# Patient Record
Sex: Female | Born: 1995 | Hispanic: No | Marital: Single | Smoking: Never smoker
Health system: Southern US, Community
[De-identification: ages and names within clinical notes are randomized; demographics above are authoritative.]

---
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
## 2015-05-25 ENCOUNTER — Emergency Department (HOSPITAL_COMMUNITY)
Admission: EM | Admit: 2015-05-25 | Discharge: 2015-05-25 | Disposition: A | Discharge status: Home / Self Care | Payer: BLUE CROSS/BLUE SHIELD | Attending: Emergency Medicine | Admitting: Emergency Medicine

## 2015-05-25 ENCOUNTER — Emergency Department (HOSPITAL_COMMUNITY): Payer: BLUE CROSS/BLUE SHIELD

## 2015-05-25 ENCOUNTER — Encounter (HOSPITAL_COMMUNITY): Payer: Self-pay | Admitting: *Deleted

## 2015-05-25 DIAGNOSIS — R1011 Right upper quadrant pain: Secondary | ICD-10-CM

## 2015-05-25 DIAGNOSIS — M25511 Pain in right shoulder: Secondary | ICD-10-CM | POA: Insufficient documentation

## 2015-05-25 DIAGNOSIS — Z3202 Encounter for pregnancy test, result negative: Secondary | ICD-10-CM | POA: Diagnosis not present

## 2015-05-25 LAB — CBC WITH DIFFERENTIAL/PLATELET
BASOS ABS: 0 10*3/uL (ref 0.0–0.1)
BASOS PCT: 0 %
EOS ABS: 0.3 10*3/uL (ref 0.0–0.7)
Eosinophils Relative: 3 %
HEMATOCRIT: 34.6 % — AB (ref 36.0–46.0)
HEMOGLOBIN: 11.7 g/dL — AB (ref 12.0–15.0)
Lymphocytes Relative: 15 %
Lymphs Abs: 1.6 10*3/uL (ref 0.7–4.0)
MCH: 27.3 pg (ref 26.0–34.0)
MCHC: 33.8 g/dL (ref 30.0–36.0)
MCV: 80.8 fL (ref 78.0–100.0)
MONOS PCT: 5 %
Monocytes Absolute: 0.6 10*3/uL (ref 0.1–1.0)
NEUTROS ABS: 8.2 10*3/uL — AB (ref 1.7–7.7)
NEUTROS PCT: 77 %
Platelets: 371 10*3/uL (ref 150–400)
RBC: 4.28 MIL/uL (ref 3.87–5.11)
RDW: 13.8 % (ref 11.5–15.5)
WBC: 10.7 10*3/uL — ABNORMAL HIGH (ref 4.0–10.5)

## 2015-05-25 LAB — PREGNANCY, URINE: PREG TEST UR: NEGATIVE

## 2015-05-25 LAB — BASIC METABOLIC PANEL
ANION GAP: 8 (ref 5–15)
BUN: 8 mg/dL (ref 6–20)
CALCIUM: 9.3 mg/dL (ref 8.9–10.3)
CHLORIDE: 105 mmol/L (ref 101–111)
CO2: 26 mmol/L (ref 22–32)
CREATININE: 0.63 mg/dL (ref 0.44–1.00)
GFR calc non Af Amer: >60 mL/min (ref >60)
Glucose, Bld: 99 mg/dL (ref 65–99)
Potassium: 3.9 mmol/L (ref 3.5–5.1)
SODIUM: 139 mmol/L (ref 135–145)

## 2015-05-25 LAB — URINALYSIS, ROUTINE W REFLEX MICROSCOPIC
Glucose, UA: NEGATIVE mg/dL
HGB URINE DIPSTICK: NEGATIVE
Ketones, ur: NEGATIVE mg/dL
Leukocytes, UA: NEGATIVE
NITRITE: NEGATIVE
PROTEIN: NEGATIVE mg/dL
Specific Gravity, Urine: 1.027 (ref 1.005–1.030)
UROBILINOGEN UA: 4 mg/dL — AB (ref 0.0–1.0)
pH: 6.5 (ref 5.0–8.0)

## 2015-05-25 LAB — LIPASE, BLOOD: LIPASE: 20 U/L (ref 11–51)

## 2015-05-25 MED ORDER — IBUPROFEN 400 MG PO TABS: 400.0000 mg | ORAL_TABLET | Freq: Four times a day (QID) | ORAL | Status: DC | PRN

## 2015-05-25 NOTE — ED Notes (Signed)
The pt is c/o  Rt shoulder pain for 4 days she just woke up  With the pain.  She was seen at an urgent care soemwhere here in town and when he lay her flat and pressed on her rt upper abd she had severe pain.Marland Kitchen. He sent her here  For  Having a gallbladder attack   Her pain is worse with breathing   No n v or diarrhea.  lmp now

## 2015-05-25 NOTE — ED Provider Notes (Signed)
CSN: [id number]     Arrival date & time 05/25/15  1548 History   First MD Initiated Contact with Patient 05/25/15 1615     Chief Complaint  Patient presents with  . Shoulder Pain     (Consider location/radiation/quality/duration/timing/severity/associated sxs/prior Treatment) HPI   19 year old female sent here by urgent care for evaluation of right shoulder pain. Patient states 4 days ago she woke up with mild sharp pain to her right shoulder. Her pain was initially minimal but has gotten progressively worse.  She reported having increasing pain when taking deep breath. She has been taking ibuprofen on occasion for pain with some improvement. Her pain has not 4 resolved and today she went to urgent care for evaluation. States when he provider pushed on her right upper abdomen she developed pain to her right shoulder. She was sent to the ED for further evaluation of this condition and to rule out gallbladder etiology. She denies having any fever, chills, neck pain, chest pain, difficulty breathing, productive cough, shortness of breath, postprandial pain, nausea vomiting diarrhea, dysuria, or rash. No history of diabetes or alcohol abuse. Patient is currently on her menstruation. She denies any history of gallbladder disease. She denies any loss of appetite. Her pain is mild at this time.  Patient also denies any prior history of PE or DVT, no recent surgery, prolonged bed rest, using oral contraception, active cancer, or hemoptysis. Denies any unilateral leg swelling or calf pain.  History reviewed. No pertinent past medical history. History reviewed. No pertinent past surgical history. No family history on file. Social History  Substance Use Topics  . Smoking status: Never Smoker   . Smokeless tobacco: None  . Alcohol Use: No   OB History    No data available     Review of Systems  All other systems reviewed and are negative.     Allergies  Review of patient's allergies indicates  no known allergies.  Home Medications   Prior to Admission medications   Not on File   BP 100/74 mmHg  Pulse 95  Temp(Src) 98 F (36.7 C) (Oral)  Resp 18  SpO2 98%  LMP 05/25/2015 Physical Exam  Constitutional: She appears well-developed and well-nourished. No distress.  HENT:  Head: Atraumatic.  Eyes: Conjunctivae are normal.  Neck: Neck supple.  Cardiovascular: Normal rate and regular rhythm.   Pulmonary/Chest: Effort normal and breath sounds normal.  Abdominal: Soft. Bowel sounds are normal. She exhibits no distension. There is tenderness (mild RUQ abd tenderness without guarding or rebound tenderness.  No pain at McBurney's point.  ).  Genitourinary:  No CVA tenderness  Musculoskeletal:  Right shoulder with full range of motion, nontender to palpation, no overlying skin changes. Normal grip strength  Neurological: She is alert.  Skin: No rash noted.  Psychiatric: She has a normal mood and affect.  Nursing note and vitals reviewed.   ED Course  Procedures (including critical care time)  Patient presents with right shoulder pain and right upper quadrant abdominal pain. Her pain is not related to eating which makes it less likely to be gallbladder etiology. She has no reproducible shoulder pain on exam. She is otherwise healthy and well-appearing. She does complain of some pleuritic component therefore chest x-ray ordered. She is PERC negative, low suspicion for PE. I also have no suspicion for cardiac etiology. I did perform a bedside ultrasound to assess her gallbladder which appears normal. However, a formal abdominal ultrasound will be ordered.  7:34 PM No significant  abnormality noted on patient's labs panel aside from elevated urobilinogen of 4.0. Liver appears normal on [place]. Pregnancy test is negative. A chest x-ray shows no active abnormalities. Abdominal and pelvis ultrasound showing no acute pathology. I discussed the finding with patient and recommend outpatient  follow-up with PCP for further evaluation. Strict return precautions discussed. Patient voiced understanding and agrees with plan. Patient is stable for discharge.  Labs Review Labs Reviewed  CBC WITH DIFFERENTIAL/PLATELET - Abnormal; Notable for the following:    WBC 10.7 (*)    Hemoglobin 11.7 (*)    HCT 34.6 (*)    Neutro Abs 8.2 (*)    All other components within normal limits  URINALYSIS, ROUTINE W REFLEX MICROSCOPIC (NOT AT [facility]) - Abnormal; Notable for the following:    Color, Urine AMBER (*)    Bilirubin Urine SMALL (*)    Urobilinogen, UA 4.0 (*)    All other components within normal limits  LIPASE, BLOOD  BASIC METABOLIC PANEL  PREGNANCY, URINE    Imaging Review Dg Chest 2 View  05/25/2015  CLINICAL DATA:  Congestion for 2 days. EXAM: CHEST  2 VIEW COMPARISON:  None. FINDINGS: The heart size and mediastinal contours are within normal limits. Both lungs are clear. No pneumothorax or pleural effusion is noted. The visualized skeletal structures are unremarkable. IMPRESSION: No active cardiopulmonary disease. Electronically Signed   By: Lupita Raider, M.D.   On: 05/25/2015 18:15   US Abdomen Complete  05/25/2015  CLINICAL DATA:  Left upper quadrant abdominal pain. EXAM: ULTRASOUND ABDOMEN COMPLETE COMPARISON:  None. FINDINGS: Gallbladder: No gallstones or wall thickening visualized. No sonographic Murphy sign noted. Common bile duct: Diameter: 2.2 mm which is within normal limits. Liver: No focal lesion identified. Within normal limits in parenchymal echogenicity. IVC: No abnormality visualized. Pancreas: Visualized portion unremarkable. Spleen: Size and appearance within normal limits. Right Kidney: Length: 10.1 cm. Echogenicity within normal limits. No mass or hydronephrosis visualized. Left Kidney: Length: 10.1 cm. Echogenicity within normal limits. No mass or hydronephrosis visualized. Abdominal aorta: No aneurysm visualized. Other findings: None. IMPRESSION: No abnormality  seen in the abdomen. Electronically Signed   By: Lupita Raider, M.D.   On: 05/25/2015 19:19   I have personally reviewed and evaluated these images and lab results as part of my medical decision-making.   EKG Interpretation None      MDM   Final diagnoses:  Right shoulder pain  RUQ abdominal pain    BP 100/74 mmHg  Pulse 95  Temp(Src) 98 F (36.7 C) (Oral)  Resp 18  SpO2 98%  LMP 05/25/2015     Fayrene Helper, PA-C 05/25/15 1937  Fayrene Helper, PA-C 05/25/15 1938  Raeford Razor, MD 05/27/15 [phone]

## 2015-05-25 NOTE — Discharge Instructions (Signed)
Abdominal Pain, Adult °Many things can cause abdominal pain. Usually, abdominal pain is not caused by a disease and will improve without treatment. It can often be observed and treated at home. Your health care provider will do a physical exam and possibly order blood tests and X-rays to help determine the seriousness of your pain. However, in many cases, more time must pass before a clear cause of the pain can be found. Before that point, your health care provider may not know if you need more testing or further treatment. °HOME CARE INSTRUCTIONS °Monitor your abdominal pain for any changes. The following actions may help to alleviate any discomfort you are experiencing: °· Only take over-the-counter or prescription medicines as directed by your health care provider. °· Do not take laxatives unless directed to do so by your health care provider. °· Try a clear liquid diet (broth, tea, or water) as directed by your health care provider. Slowly move to a bland diet as tolerated. °SEEK MEDICAL CARE IF: °· You have unexplained abdominal pain. °· You have abdominal pain associated with nausea or diarrhea. °· You have pain when you urinate or have a bowel movement. °· You experience abdominal pain that wakes you in the night. °· You have abdominal pain that is worsened or improved by eating food. °· You have abdominal pain that is worsened with eating fatty foods. °· You have a fever. °SEEK IMMEDIATE MEDICAL CARE IF: °· Your pain does not go away within 2 hours. °· You keep throwing up (vomiting). °· Your pain is felt only in portions of the abdomen, such as the right side or the left lower portion of the abdomen. °· You pass bloody or black tarry stools. °MAKE SURE YOU: °· Understand these instructions. °· Will watch your condition. °· Will get help right away if you are not doing well or get worse. °  °This information is not intended to replace advice given to you by your health care provider. Make sure you discuss  any questions you have with your health care provider. °  °Document Released: 04/28/2005 Document Revised: 04/09/2015 Document Reviewed: 03/28/2013 °Elsevier Interactive Patient Education ©2016 Elsevier Inc. ° ° °Emergency Department Resource Guide °1) Find a Doctor and Pay Out of Pocket °Although you won't have to find out who is covered by your insurance plan, it is a good idea to ask around and get recommendations. You will then need to call the office and see if the doctor you have chosen will accept you as a new patient and what types of options they offer for patients who are self-pay. Some doctors offer discounts or will set up payment plans for their patients who do not have insurance, but you will need to ask so you aren't surprised when you get to your appointment. ° °2) Contact Your Local Health Department °Not all health departments have doctors that can see patients for sick visits, but many do, so it is worth a call to see if yours does. If you don't know where your local health department is, you can check in your phone book. The CDC also has a tool to help you locate your state's health department, and many state websites also have listings of all of their local health departments. ° °3) Find a Walk-in Clinic °If your illness is not likely to be very severe or complicated, you may want to try a walk in clinic. These are popping up all over the country in pharmacies, drugstores, and shopping centers.   They're usually staffed by nurse practitioners or physician assistants that have been trained to treat common illnesses and complaints. They're usually fairly quick and inexpensive. However, if you have serious medical issues or chronic medical problems, these are probably not your best option. ° °No Primary Care Doctor: °- Call [facility] at  832-8000 - they can help you locate a primary care doctor that  accepts your insurance, provides certain services, etc. °- Physician Referral Service-  1-800-533-3463 ° °Chronic Pain Problems: °Organization         Address  Phone   Notes  °[facility]  (336) 297-2271 Patients need to be referred by their primary care doctor.  ° °Medication Assistance: °Organization         Address  Phone   Notes  °[place] [facility] [address] °[place], [place] [address] (336) 641-8030 --Must be a resident of [place] °-- Must have NO insurance coverage whatsoever (no Medicaid/ Medicare, etc.) °-- The pt. MUST have a primary care doctor that directs their care regularly and follows them in the community °  °[facility]  (866) 331-1348   °[employer]  (888) 892-1162   ° °Agencies that provide inexpensive medical care: °Organization         Address  Phone   Notes  °[facility]  (336) 832-8035   °[facility]    (336) 832-7272   °[facility] [address] °[place], [place] [address] (336) 832-4777   °[facility] [address], °[place] (336) 271-4999   °[facility]    (336) 373-0678   °[facility]    (336) 272-1050   °[facility] ° [address], [place] Phone:  (336) 832-4444, Fax:  (336) 832-4440 Hours of Operation:  9 am - 6 pm, M-F.  Also accepts Medicaid/Medicare and self-pay.  °[facility] ° [address], [place] Phone: (336) [phone], Fax: (336) [phone]. Hours of Operation:  8:30 am - 5:30 pm, M-F.  Also accepts Medicaid and self-pay.  °[facility] [place] [address], [place] Phone: (336) 878-6027   °[facility] [address], [place], [place] (336)723-1848, Ext. 123 Mondays & Thursdays: 7-9 AM.  First 15 patients are seen on a first come, first serve basis. °  ° °Medicaid-accepting [place] Providers: ° °Organization         Address  Phone   Notes  °[facility] [address],  [place] (336) 641-2100 Also accepts self-pay patients.  °[facility] [address], [place] ° (336) 856-9996   °[facility] [address], [place] (336) 288-8857   °[facility] [address] [place] [address], [place] (336) 299-7000   °Veita Bland [address], [place]  ° (336) 373-1557 Only accepts [place] Access Medicaid patients after they have their name applied to their card.  ° °Self-Pay (no insurance) in [place]: ° °Organization         Address  Phone   Notes  °Sickle Cell Patients, [facility] [address], [place] (336) 832-1970   °[facility] [address], [place] (336) 832-4400   °[facility] [place] [facility] ° [address], [place] (336) 992-4800   °  [facility]/Dr. Osei-Bonsu ° [address] [place] [address], [place] or [address], [place] (336) [phone] Phone number for both [place] and [place] locations is the same.  °[facility] [address], [place] (336) 299-0000   °[facility] [address] [place] [address], [place] or [address] (336) 852-7530 °(336) 878-2260   °[facility] [address], [place] (336) 350-1642, phone; (336) 294-5005, fax Sees patients 1st and 3rd Saturday of every month.  Must not qualify for public or private insurance (i.e. Medicaid, Medicare, [place] Health Choice, Veterans' Benefits) • Household income should be no more than 200% of the poverty level •The clinic cannot treat you if you are pregnant or think you are pregnant • Sexually transmitted diseases are not treated at the clinic.  ° ° °Dental Care: °Organization         Address  Phone  Notes  °[facility] [address], [place] (336) 641-6152 Accepts children up to age 21 who are enrolled in  Medicaid or [place] Health Choice; pregnant women with a Medicaid card; and children who have applied for Medicaid or [place] Health Choice, but were declined, whose parents can pay a reduced fee at time of service.  °[facility] [place]  [address], [place] (336) 641-7733 Accepts children up to age 21 who are enrolled in Medicaid or [place] Health Choice; pregnant women with a Medicaid card; and children who have applied for Medicaid or [place] Health Choice, but were declined, whose parents can pay a reduced fee at time of service.  °[facility] ° [address], [place] (336) 641-4533 Patients are seen by appointment only. Walk-ins are not accepted. [facility] will see patients 18 years of age and older. °Monday - Tuesday (8am-5pm) °Most Wednesdays (8:30-5pm) °$30 per visit, cash only  °[facility] ° [address], [place] (336) 641-4533 Patients are seen by appointment only. Walk-ins are not accepted. [facility] will see patients 18 years of age and older. °One Wednesday Evening (Monthly: Volunteer Based).  $30 per visit, cash only  °[facility]  (919) 537-3737 for adults; Children under age 4, call Graduate Pediatric Dentistry at (919) [phone]. Children aged 4-14, please call (919) [phone] to request a pediatric application. ° Dental services are provided in all areas of dental care including fillings, crowns and bridges, complete and partial dentures, implants, gum treatment, root canals, and extractions. Preventive care is also provided. Treatment is provided to both adults and children. °Patients are selected via a lottery and there is often a waiting list. °  °[facility] [address], ° ° (336) 763-8833 [email or web address] °  °[facility] [address], [place], [place] (336)723-1848, Ext. 123 Second and Fourth Thursday of each month, opens at 6:30  AM; Clinic ends at 9 AM.  Patients are seen on a first-come first-served basis, and a limited number are seen during each clinic.  ° °[facility] ° [address], [place], [place] (336) 723-7904   Eligibility Requirements °You must have lived in [place], [place], or [place] counties for at least the last three months. °  You cannot be eligible for state or federal sponsored healthcare insurance, including Veterans Administration, Medicaid, or Medicare. °  You generally cannot be eligible for healthcare insurance   through your employer.  °  How to apply: °Eligibility screenings are held every Tuesday and Wednesday afternoon from 1:00 pm until 4:00 pm. You do not need an appointment for the interview!  °[facility] [address], [place], [place] 336-631-2330   °[facility]  336-342-8273   °[facility]  336-703-3100   °[facility]  336-570-6415   ° °Behavioral Health Resources in the Community: °Intensive Outpatient Programs °Organization         Address  Phone  Notes  °[facility] [address], [place], [place] 336-878-6098   °[facility] [address], [place], [place] 336-832-9800   °[facility] [address], [place], [place] ° 336-882-2125   °[facility] [address],  °[place], [place] 1-800-853-5163 or 336-641-4981   °Substance Abuse Resources °Organization         Address  Phone  Notes  °[facility]  336-882-2125   °[facility]  336-784-9470   °[facility]  336-285-9073   °[facility]  336-845-3988   °Residential & Outpatient Substance Abuse Program  1-800-659-3381   °Psychological Services °Organization         Address  Phone  Notes  °[facility]  336- 832-9600   °[facility]  336- 378-7881   °[facility] [address], [place] 1-800-853-5163 or  336-641-4981   ° °Mobile Crisis Teams °Organization         Address  Phone  Notes  °[facility], Mobile Crisis Care Unit  1-877-626-1772   °Assertive °[facility] ° [address] [place], [place] 336-834-9664   °Sharon DeEsch [address] °[place] 336-554-5454   ° °Self-Help/Support Groups °Organization         Address  Phone             Notes  °[facility] - variety of support groups  336- 373-1402 Call for more information  °Narcotics Anonymous (NA), [facility] [address], °[place]  2 meetings at this location  ° °Residential Treatment Programs °Organization         Address  Phone  Notes  °[facility] [address],    °[place]  1-866-801-8205   °[facility] ° [address], [place], [place] 704-293-8524   °[facility] [address], [place] 336-845-3988 Admissions: 8am-3pm M-F  °[facility] [address]    °[place], [place] 336-841-1104   °[facility] [address], [place], [place] 336-379-7146   °[facility] [address]  °[place], [place] 336-285-9073   °[facility] - Intensive Outpatient [address], [place], [place] 336-852-3033   °[facility] [address]  °[place], [place] 1-877-615-2722 or 336-784-9470   °[facility] [address] , [place] 336-227-7417 Accepts Medicaid  °[facility] [address]  °[place] 1-800-659-3381 Substance Abuse/Addiction Treatment  ° °[place] Behavioral Health Resources °Organization         Address  Phone  Notes  °[facility]  (888) 581-9988   °Julie Brannon, PhD [address] [place], [place]   (336) 349-5553 or (336) 951-0000   °[facility]   [address] °[place],  (336) 349-4454   °  [facility] [address],  [place], [place] (336) 342-8316 Insurance/Medicaid/sponsorship through [facility]  °[facility] [address]                                    [place], [place] (336) 342-8316 Therapy/tele-psych/case  °[facility] [address]  ° [place], [place] (336) 349-2233    °Dr. Arfeen  (336) 349-4544   °[facility] [place]  [employer] [place] [facility] 1) [address], [place] °2) [address], [place] °3)  [address], [place] (336) 349-3220 °(336) 342-7768 ° °(336) 342-8140   °[place] Child Abuse Hotline (336) 342-1394 or (336) 342-3537 (After Hours)    ° ° ° °

## 2015-06-02 ENCOUNTER — Emergency Department (HOSPITAL_COMMUNITY): Payer: BLUE CROSS/BLUE SHIELD

## 2015-06-02 ENCOUNTER — Emergency Department (HOSPITAL_COMMUNITY)
Admission: EM | Admit: 2015-06-02 | Discharge: 2015-06-02 | Disposition: A | Discharge status: Home / Self Care | Payer: BLUE CROSS/BLUE SHIELD | Attending: Emergency Medicine | Admitting: Emergency Medicine

## 2015-06-02 ENCOUNTER — Encounter (HOSPITAL_COMMUNITY): Payer: Self-pay | Admitting: Family Medicine

## 2015-06-02 DIAGNOSIS — R109 Unspecified abdominal pain: Secondary | ICD-10-CM | POA: Diagnosis present

## 2015-06-02 DIAGNOSIS — M25512 Pain in left shoulder: Secondary | ICD-10-CM | POA: Insufficient documentation

## 2015-06-02 DIAGNOSIS — R0789 Other chest pain: Secondary | ICD-10-CM | POA: Insufficient documentation

## 2015-06-02 DIAGNOSIS — M25511 Pain in right shoulder: Secondary | ICD-10-CM | POA: Insufficient documentation

## 2015-06-02 DIAGNOSIS — Z3202 Encounter for pregnancy test, result negative: Secondary | ICD-10-CM | POA: Diagnosis not present

## 2015-06-02 DIAGNOSIS — N390 Urinary tract infection, site not specified: Secondary | ICD-10-CM | POA: Diagnosis not present

## 2015-06-02 DIAGNOSIS — R0602 Shortness of breath: Secondary | ICD-10-CM | POA: Diagnosis not present

## 2015-06-02 LAB — URINALYSIS, ROUTINE W REFLEX MICROSCOPIC
BILIRUBIN URINE: NEGATIVE
Glucose, UA: NEGATIVE mg/dL
Hgb urine dipstick: NEGATIVE
Ketones, ur: NEGATIVE mg/dL
NITRITE: NEGATIVE
PH: 6 (ref 5.0–8.0)
Protein, ur: NEGATIVE mg/dL
SPECIFIC GRAVITY, URINE: 1.032 — AB (ref 1.005–1.030)
Urobilinogen, UA: 1 mg/dL (ref 0.0–1.0)

## 2015-06-02 LAB — CBC WITH DIFFERENTIAL/PLATELET
Basophils Absolute: 0 10*3/uL (ref 0.0–0.1)
Basophils Relative: 0 %
EOS PCT: 1 %
Eosinophils Absolute: 0.2 10*3/uL (ref 0.0–0.7)
HEMATOCRIT: 34.5 % — AB (ref 36.0–46.0)
HEMOGLOBIN: 11.6 g/dL — AB (ref 12.0–15.0)
LYMPHS ABS: 3 10*3/uL (ref 0.7–4.0)
LYMPHS PCT: 23 %
MCH: 27.5 pg (ref 26.0–34.0)
MCHC: 33.6 g/dL (ref 30.0–36.0)
MCV: 81.8 fL (ref 78.0–100.0)
Monocytes Absolute: 1.2 10*3/uL — ABNORMAL HIGH (ref 0.1–1.0)
Monocytes Relative: 9 %
NEUTROS ABS: 8.6 10*3/uL — AB (ref 1.7–7.7)
NEUTROS PCT: 67 %
Platelets: 424 10*3/uL — ABNORMAL HIGH (ref 150–400)
RBC: 4.22 MIL/uL (ref 3.87–5.11)
RDW: 14.1 % (ref 11.5–15.5)
WBC: 13 10*3/uL — AB (ref 4.0–10.5)

## 2015-06-02 LAB — BASIC METABOLIC PANEL
Anion gap: 8 (ref 5–15)
BUN: 24 mg/dL — AB (ref 6–20)
CALCIUM: 8.8 mg/dL — AB (ref 8.9–10.3)
CHLORIDE: 103 mmol/L (ref 101–111)
CO2: 28 mmol/L (ref 22–32)
Creatinine, Ser: 0.75 mg/dL (ref 0.44–1.00)
GFR calc Af Amer: >60 mL/min (ref >60)
GLUCOSE: 99 mg/dL (ref 65–99)
POTASSIUM: 3.8 mmol/L (ref 3.5–5.1)
Sodium: 139 mmol/L (ref 135–145)

## 2015-06-02 LAB — URINE MICROSCOPIC-ADD ON

## 2015-06-02 LAB — HEPATIC FUNCTION PANEL
ALT: 16 U/L (ref 14–54)
AST: 12 U/L — AB (ref 15–41)
Albumin: 3.2 g/dL — ABNORMAL LOW (ref 3.5–5.0)
Alkaline Phosphatase: 66 U/L (ref 38–126)
BILIRUBIN TOTAL: 0.3 mg/dL (ref 0.3–1.2)
Total Protein: 6.8 g/dL (ref 6.5–8.1)

## 2015-06-02 LAB — PREGNANCY, URINE: Preg Test, Ur: NEGATIVE

## 2015-06-02 LAB — D-DIMER, QUANTITATIVE: D-Dimer, Quant: 2.42 ug/mL-FEU — ABNORMAL HIGH (ref 0.00–0.48)

## 2015-06-02 MED ORDER — NAPROXEN 250 MG PO TABS: 250.0000 mg | ORAL_TABLET | Freq: Two times a day (BID) | ORAL | Status: DC

## 2015-06-02 MED ORDER — CEPHALEXIN 500 MG PO CAPS: 500.0000 mg | ORAL_CAPSULE | Freq: Four times a day (QID) | ORAL | Status: DC

## 2015-06-02 MED ORDER — IOHEXOL 350 MG/ML SOLN
100.0000 mL | Freq: Once | INTRAVENOUS | Status: AC | PRN
  Administered 2015-06-02: 75 mL via INTRAVENOUS

## 2015-06-02 NOTE — ED Provider Notes (Signed)
CSN: [id number]     Arrival date & time 06/02/15  4098 History   First MD Initiated Contact with Patient 06/02/15 0602     Chief Complaint  Patient presents with  . Flank Pain   Kelly Hill is a 19 y.o. female who is otherwise healthy who presents to the emergency department complaining of intermittent bilateral shoulder pain and right lateral chest wall ongoing for the past 2 weeks. She reports she has intermittent sharp pains that are worse at night and with laying down. She reports she has intermittent episodes lasting several hours at times where it hurts to take a deep breath and she feels short of breath. She reports she has constant pain in her bilateral shoulders and right lateral chest wall but this fluctuates in intensity. She reports her pain is not related to eating. The patient reports she was seen in urgent care in the emergency department for her pain. The patient has had a chest x-ray and a complete abdominal ultrasound on 05/25/2015 which were unremarkable. Patient currently reports she has very mild pain but this can fluctuate greatly in intensity. Patient denies personal or close family history of DVTs or PEs. She denies endogenous estrogen use. She denies fevers, chills, leg pain, leg swelling, abdominal pain, nausea, vomiting, diarrhea, cough, wheezing, urinary symptoms, vaginal bleeding, vaginal discharge, or rashes.    (Consider location/radiation/quality/duration/timing/severity/associated sxs/prior Treatment) HPI  History reviewed. No pertinent past medical history. History reviewed. No pertinent past surgical history. History reviewed. No pertinent family history. Social History  Substance Use Topics  . Smoking status: Never Smoker   . Smokeless tobacco: None  . Alcohol Use: No   OB History    No data available     Review of Systems  Constitutional: Negative for fever and chills.  HENT: Negative for congestion and sore throat.   Eyes: Negative for visual  disturbance.  Respiratory: Positive for shortness of breath (intermittent ). Negative for cough and wheezing.   Cardiovascular: Negative for chest pain and palpitations.  Gastrointestinal: Negative for nausea, vomiting, abdominal pain, diarrhea and blood in stool.  Genitourinary: Positive for flank pain. Negative for dysuria, urgency, frequency, hematuria, decreased urine volume, vaginal bleeding, vaginal discharge, difficulty urinating and menstrual problem.  Musculoskeletal: Negative for back pain and neck pain.  Skin: Negative for rash.  Neurological: Negative for syncope, light-headedness and headaches.      Allergies  Review of patient's allergies indicates no known allergies.  Home Medications   Prior to Admission medications   Medication Sig Start Date End Date Taking? Authorizing Provider  cyclobenzaprine (FLEXERIL) 10 MG tablet Take 10 mg by mouth daily. 05/26/15  Yes Historical Provider, MD  cephALEXin (KEFLEX) 500 MG capsule Take 1 capsule (500 mg total) by mouth 4 (four) times daily. 06/02/15   Everlene Farrier, PA-C  naproxen (NAPROSYN) 250 MG tablet Take 1 tablet (250 mg total) by mouth 2 (two) times daily with a meal. 06/02/15   Everlene Farrier, PA-C  predniSONE (STERAPRED UNI-PAK 21 TAB) 10 MG (21) TBPK tablet Take 10-60 mg by mouth as directed. Take 6-5-4-3-2-1 tablets on consecutive days for 6 days. 05/26/15   Historical Provider, MD   BP 120/75 mmHg  Pulse 102  Temp(Src) 98 F (36.7 C) (Oral)  Resp 23  Ht 5\' 2"  (1.575 m)  Wt 130 lb (58.968 kg)  BMI 23.77 kg/m2  SpO2 100%  LMP 05/25/2015 Physical Exam  Constitutional: She appears well-developed and well-nourished. No distress.  Nontoxic appearing.  HENT:  Head:  Normocephalic and atraumatic.  Mouth/Throat: Oropharynx is clear and moist.  Eyes: Conjunctivae are normal. Pupils are equal, round, and reactive to light. Right eye exhibits no discharge. Left eye exhibits no discharge.  Neck: Normal range of motion.  Neck supple. No JVD present.  Cardiovascular: Regular rhythm, normal heart sounds and intact distal pulses.  Exam reveals no gallop and no friction rub.   No murmur heard. Heart rate is 105. Bilateral radial, posterior tibialis and dorsalis pedis pulses are intact.    Pulmonary/Chest: Effort normal and breath sounds normal. No respiratory distress. She has no wheezes. She has no rales. She exhibits no tenderness.  Lungs are clear to auscultation bilaterally. No chest wall tenderness.  Abdominal: Soft. Bowel sounds are normal. She exhibits no distension. There is no tenderness. There is no rebound and no guarding.  Abdomen is soft and nontender to palpation. Bowel sounds are present.  Musculoskeletal: She exhibits no edema or tenderness.  No lower extremity edema or tenderness. No bony point shoulder tenderness bilaterally. No pain with manipulation of her bilateral shoulders.  Lymphadenopathy:    She has no cervical adenopathy.  Neurological: She is alert. Coordination normal.  Skin: Skin is warm and dry. No rash noted. She is not diaphoretic. No erythema. No pallor.  Psychiatric: She has a normal mood and affect. Her behavior is normal.  Nursing note and vitals reviewed.   ED Course  Procedures (including critical care time) Labs Review Labs Reviewed  URINALYSIS, ROUTINE W REFLEX MICROSCOPIC (NOT AT [facility]) - Abnormal; Notable for the following:    APPearance CLOUDY (*)    Specific Gravity, Urine 1.032 (*)    Leukocytes, UA TRACE (*)    All other components within normal limits  CBC WITH DIFFERENTIAL/PLATELET - Abnormal; Notable for the following:    WBC 13.0 (*)    Hemoglobin 11.6 (*)    HCT 34.5 (*)    Platelets 424 (*)    Neutro Abs 8.6 (*)    Monocytes Absolute 1.2 (*)    All other components within normal limits  BASIC METABOLIC PANEL - Abnormal; Notable for the following:    BUN 24 (*)    Calcium 8.8 (*)    All other components within normal limits  URINE MICROSCOPIC-ADD  ON - Abnormal; Notable for the following:    Squamous Epithelial / LPF FEW (*)    Bacteria, UA MANY (*)    All other components within normal limits  HEPATIC FUNCTION PANEL - Abnormal; Notable for the following:    Albumin 3.2 (*)    AST 12 (*)    Bilirubin, Direct <0.1 (*)    All other components within normal limits  D-DIMER, QUANTITATIVE (NOT AT [facility]) - Abnormal; Notable for the following:    D-Dimer, Quant 2.42 (*)    All other components within normal limits  URINE CULTURE  PREGNANCY, URINE    Imaging Review Ct Angio Chest Pe W/cm &/or Wo Cm  06/02/2015  CLINICAL DATA:  Right-sided chest pain and shortness of breath. EXAM: CT ANGIOGRAPHY CHEST WITH CONTRAST TECHNIQUE: Multidetector CT imaging of the chest was performed using the standard protocol during bolus administration of intravenous contrast. Multiplanar CT image reconstructions and MIPs were obtained to evaluate the vascular anatomy. CONTRAST:  75mL OMNIPAQUE IOHEXOL 350 MG/ML SOLN COMPARISON:  None. FINDINGS: There is adequate opacification of the pulmonary arteries. There is no pulmonary embolus. The main pulmonary artery, right main pulmonary artery and left main pulmonary arteries are normal in size. The heart  size is normal. There is no pericardial effusion. The thoracic aorta is normal in caliber. There is no thoracic aortic dissection. There is mild bibasilar atelectasis. The lungs are otherwise clear. There is no focal consolidation, pleural effusion or pneumothorax. There is no axillary, hilar, or mediastinal adenopathy. There is no lytic or blastic osseous lesion. The visualized portions of the upper abdomen are unremarkable. Review of the MIP images confirms the above findings. IMPRESSION: 1. No evidence of pulmonary embolus. 2. No acute cardiopulmonary disease. No findings to explain the patient's right-sided chest pain. Electronically Signed   By: Elige KoHetal  Patel   On: 06/02/2015 11:15   I have personally reviewed and  evaluated these images and lab results as part of my medical decision-making.   EKG Interpretation   Date/Time:  Monday June 02 2015 07:53:54 EDT Ventricular Rate:  97 PR Interval:  118 QRS Duration: 88 QT Interval:  349 QTC Calculation: 443 R Axis:   69 Text Interpretation:  Sinus rhythm Borderline short PR interval RSR' in V1  or V2, probably normal variant No old tracing to compare Confirmed by  GOLDSTON  MD, SCOTT ([id number]) on 06/02/2015 9:14:51 AM      Filed Vitals:   06/02/15 0915 06/02/15 0930 06/02/15 1131 06/02/15 1200  BP:  118/72 133/89 120/75  Pulse: 102 92 118 102  Temp:      TempSrc:      Resp: 21 19 18 23   Height:      Weight:      SpO2: 100% 100% 99% 100%     MDM   Meds given in ED:  Medications  iohexol (OMNIPAQUE) 350 MG/ML injection 100 mL (75 mLs Intravenous Contrast Given 06/02/15 1044)    Discharge Medication List as of 06/02/2015 12:16 PM    START taking these medications   Details  cephALEXin (KEFLEX) 500 MG capsule Take 1 capsule (500 mg total) by mouth 4 (four) times daily., Starting 06/02/2015, Until Discontinued, Print    naproxen (NAPROSYN) 250 MG tablet Take 1 tablet (250 mg total) by mouth 2 (two) times daily with a meal., Starting 06/02/2015, Until Discontinued, Print        Final diagnoses:  Right flank pain  UTI (lower urinary tract infection)  Bilateral shoulder pain   This is a 19 y.o. female who is otherwise healthy who presents to the emergency department complaining of intermittent bilateral shoulder pain and right lateral chest wall ongoing for the past 2 weeks. She reports she has intermittent sharp pains that are worse at night and with laying down. She reports she has intermittent episodes lasting several hours at times where it hurts to take a deep breath and she feels short of breath. She reports she has constant pain in her bilateral shoulders and right lateral chest wall but this fluctuates in intensity. She reports  her pain is not related to eating. The patient reports she was seen in urgent care in the emergency department for her pain. The patient has had a chest x-ray and a complete abdominal ultrasound on 05/25/2015 which were unremarkable. No evidence of hydronephrosis. On exam patient is afebrile and nontoxic appearing. Patient is mildly tachycardic with a heart rate of 110 during my interview. Her lungs are clear to auscultation bilaterally. She has no bony point tenderness or chest wall tenderness on exam. She is no pain with manipulation of her shoulder joint. No lower extremity edema or tenderness.  I reviewed her abdominal ultrasound that showed no evidence of hydronephrosis and  was unremarkable. I also evaluated her chest x-ray which was unremarkable.  CBC today indicates a mild leukocytosis of 13,000. Hemoglobin is 11.6 and stable. BMP is unremarkable. Hepatic function is unremarkable. Urinalysis shows trace leukocytes with many bacteria. Patient denies urinary symptoms. As the patient complains of intermittent periods where she feels short of breath and has pain in her flank will obtain d-dimer to rule out PE.  D-dimer returned elevated 2.42. Will obtain CT angiogram of her chest. CT imaging of the chest indicates no evidence of pulmonary embolism and no finding cyst explain the patient's right-sided chest pain. After discussing with my attending Dr. Criss Alvine will send urine for culture and treat her trace leukocytes and many bacteria on her urinalysis is a urinary tract infection. Will check Keflex 500 mg 4 times a day for 10 days. This could be the possible cause of her flank pain. Patient is in agreement with this plan. Prior to discharge the patient reports she feels comfortable and ready for discharge. Will provide her with prescription for Naprosyn for pain control. I encouraged close follow-up by primary care. I advised strict return precautions. I advised the patient to follow-up with their primary  care provider this week. I advised the patient to return to the emergency department with new or worsening symptoms or new concerns. The patient verbalized understanding and agreement with plan.    This patient was discussed with Dr. Criss Alvine who agrees with assessment and plan.     Everlene Farrier, PA-C 06/02/15 1620  Pricilla Loveless, MD 06/05/15 [phone]

## 2015-06-02 NOTE — ED Notes (Signed)
MD at bedside. 

## 2015-06-02 NOTE — ED Notes (Signed)
Pt returned from CT °

## 2015-06-02 NOTE — Discharge Instructions (Signed)
Flank Pain Flank pain refers to pain that is located on the side of the body between the upper abdomen and the back. The pain may occur over a short period of time (acute) or may be long-term or reoccurring (chronic). It may be mild or severe. Flank pain can be caused by many things. CAUSES  Some of the more common causes of flank pain include:  Muscle strains.   Muscle spasms.   A disease of your spine (vertebral disk disease).   A lung infection (pneumonia).   Fluid around your lungs (pulmonary edema).   A kidney infection.   Kidney stones.   A very painful skin rash caused by the chickenpox virus (shingles).   Gallbladder disease.  HOME CARE INSTRUCTIONS  Home care will depend on the cause of your pain. In general,  Rest as directed by your caregiver.  Drink enough fluids to keep your urine clear or pale yellow.  Only take over-the-counter or prescription medicines as directed by your caregiver. Some medicines may help relieve the pain.  Tell your caregiver about any changes in your pain.  Follow up with your caregiver as directed. SEEK IMMEDIATE MEDICAL CARE IF:   Your pain is not controlled with medicine.   You have new or worsening symptoms.  Your pain increases.   You have abdominal pain.   You have shortness of breath.   You have persistent nausea or vomiting.   You have swelling in your abdomen.   You feel faint or pass out.   You have blood in your urine.  You have a fever or persistent symptoms for more than 2-3 days.  You have a fever and your symptoms suddenly get worse. MAKE SURE YOU:   Understand these instructions.  Will watch your condition.  Will get help right away if you are not doing well or get worse.   This information is not intended to replace advice given to you by your health care provider. Make sure you discuss any questions you have with your health care provider.   Document Released: 09/09/2005 Document  Revised: 04/12/2012 Document Reviewed: 03/02/2012 Elsevier Interactive Patient Education 2016 Else[employer] Inc[employer] Urinary Tract Infection Urinary tract infections (UTIs) can develop anywhere along your urinary tract. Your urinary tract is your body's drainage system for removing wastes and extra water. Your urinary tract includes two kidneys, two ureters, a bladder, and a urethra. Your kidneys are a pair of bean-shaped organs. Each kidney is about the size of your fist. They are located below your ribs, one on each side of your spine. CAUSES Infections are caused by microbes, which are microscopic organisms, including fungi, viruses, and bacteria. These organisms are so small that they can only be seen through a microscope. Bacteria are the microbes that most commonly cause UTIs. SYMPTOMS  Symptoms of UTIs may vary by age and gender of the patient and by the location of the infection. Symptoms in young women typically include a frequent and intense urge to urinate and a painful, burning feeling in the bladder or urethra during urination. Older women and men are more likely to be tired, shaky, and weak and have muscle aches and abdominal pain. A fever may mean the infection is in your kidneys. Other symptoms of a kidney infection include pain in your back or sides below the ribs, nausea, and vomiting. DIAGNOSIS To diagnose a UTI, your caregiver will ask you about your symptoms. Your caregiver will also ask you to provide a urine sample. The urine  sample will be tested for bacteria and white blood cells. White blood cells are made by your body to help fight infection. TREATMENT  Typically, UTIs can be treated with medication. Because most UTIs are caused by a bacterial infection, they usually can be treated with the use of antibiotics. The choice of antibiotic and length of treatment depend on your symptoms and the type of bacteria causing your infection. HOME CARE INSTRUCTIONS  If you were prescribed  antibiotics, take them exactly as your caregiver instructs you. Finish the medication even if you feel better after you have only taken some of the medication.  Drink enough water and fluids to keep your urine clear or pale yellow.  Avoid caffeine, tea, and carbonated beverages. They tend to irritate your bladder.  Empty your bladder often. Avoid holding urine for long periods of time.  Empty your bladder before and after sexual intercourse.  After a bowel movement, women should cleanse from front to back. Use each tissue only once. SEEK MEDICAL CARE IF:   You have back pain.  You develop a fever.  Your symptoms do not begin to resolve within 3 days. SEEK IMMEDIATE MEDICAL CARE IF:   You have severe back pain or lower abdominal pain.  You develop chills.  You have nausea or vomiting.  You have continued burning or discomfort with urination. MAKE SURE YOU:   Understand these instructions.  Will watch your condition.  Will get help right away if you are not doing well or get worse.   This information is not intended to replace advice given to you by your health care provider. Make sure you discuss any questions you have with your health care provider.   Document Released: 04/28/2005 Document Revised: 04/09/2015 Document Reviewed: 08/27/2011 Elsevier Interactive Patient Education 2016 Elsevier Inc. Shoulder Pain The shoulder is the joint that connects your arms to your body. The bones that form the shoulder joint include the upper arm bone (humerus), the shoulder blade (scapula), and the collarbone (clavicle). The top of the humerus is shaped like a ball and fits into a rather flat socket on the scapula (glenoid cavity). A combination of muscles and strong, fibrous tissues that connect muscles to bones (tendons) support your shoulder joint and hold the ball in the socket. Small, fluid-filled sacs (bursae) are located in different areas of the joint. They act as cushions between  the bones and the overlying soft tissues and help reduce friction between the gliding tendons and the bone as you move your arm. Your shoulder joint allows a wide range of motion in your arm. This range of motion allows you to do things like scratch your back or throw a ball. However, this range of motion also makes your shoulder more prone to pain from overuse and injury. Causes of shoulder pain can originate from both injury and overuse and usually can be grouped in the following four categories:  Redness, swelling, and pain (inflammation) of the tendon (tendinitis) or the bursae (bursitis).  Instability, such as a dislocation of the joint.  Inflammation of the joint (arthritis).  Broken bone (fracture). HOME CARE INSTRUCTIONS   Apply ice to the sore area.  Put ice in a plastic bag.  Place a towel between your skin and the bag.  Leave the ice on for 15-20 minutes, 3-4 times per day for the first 2 days, or as directed by your health care provider.  Stop using cold packs if they do not help with the pain.  If  you have a shoulder sling or immobilizer, wear it as long as your caregiver instructs. Only remove it to shower or bathe. Move your arm as little as possible, but keep your hand moving to prevent swelling.  Squeeze a soft ball or foam pad as much as possible to help prevent swelling.  Only take over-the-counter or prescription medicines for pain, discomfort, or fever as directed by your caregiver. SEEK MEDICAL CARE IF:   Your shoulder pain increases, or new pain develops in your arm, hand, or fingers.  Your hand or fingers become cold and numb.  Your pain is not relieved with medicines. SEEK IMMEDIATE MEDICAL CARE IF:   Your arm, hand, or fingers are numb or tingling.  Your arm, hand, or fingers are significantly swollen or turn white or blue. MAKE SURE YOU:   Understand these instructions.  Will watch your condition.  Will get help right away if you are not doing  well or get worse.   This information is not intended to replace advice given to you by your health care provider. Make sure you discuss any questions you have with your health care provider.   Document Released: 04/28/2005 Document Revised: 08/09/2014 Document Reviewed: 11/11/2014 Elsevier Interactive Patient Education [employer]

## 2015-06-02 NOTE — ED Notes (Signed)
Pt to CT

## 2015-06-02 NOTE — ED Notes (Signed)
Pt is complaining of right flank pain that started "sometime last week, I think". Denies any urinary symptoms, fever, nausea, vomiting, diarrhea, or vaginal discharge.

## 2015-06-03 LAB — URINE CULTURE: CULTURE: NO GROWTH

## 2016-12-27 IMAGING — CR DG CHEST 2V
2 series · 2 of 2 positions shown · non-contrast
Comparison: None.

CLINICAL DATA: Congestion for 2 days.

EXAM:
CHEST  2 VIEW

[chest pa]
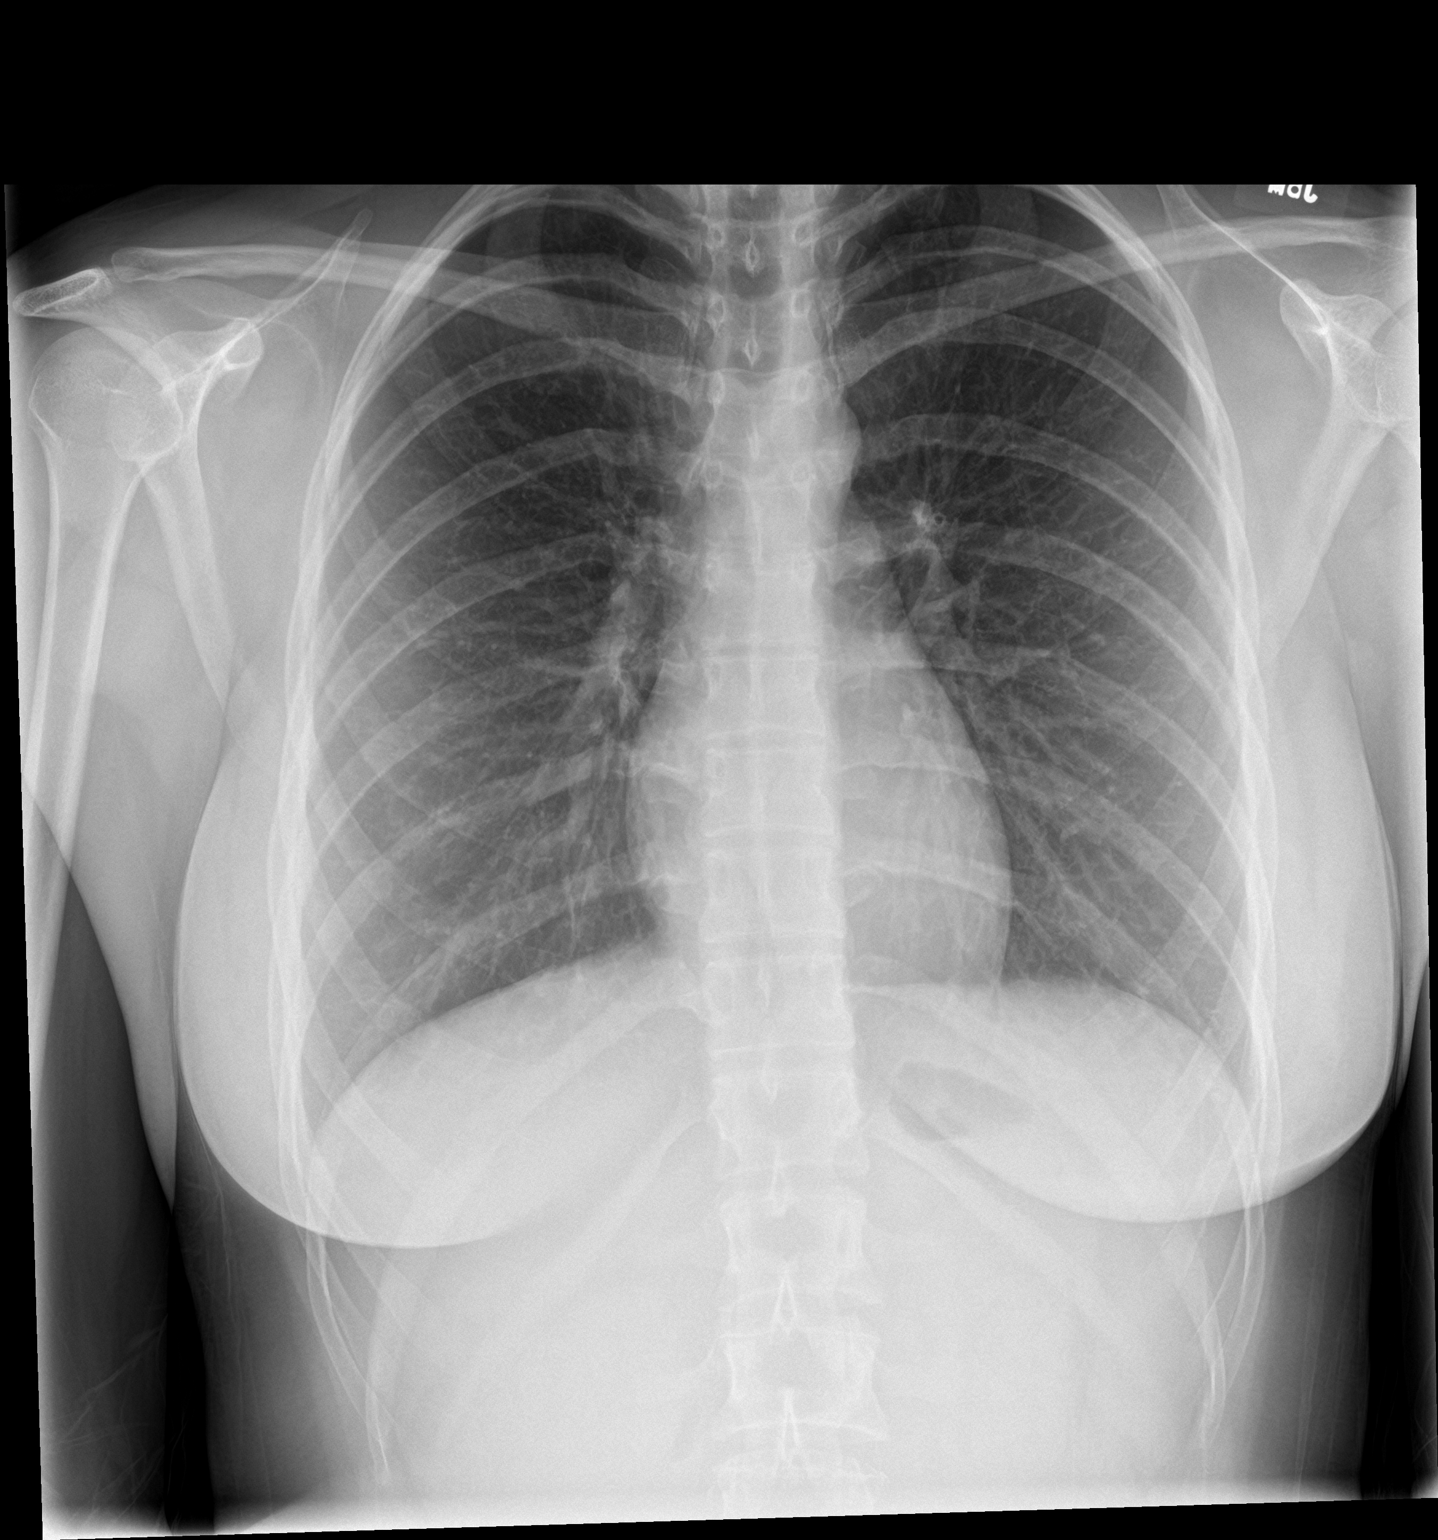

[chest lat]
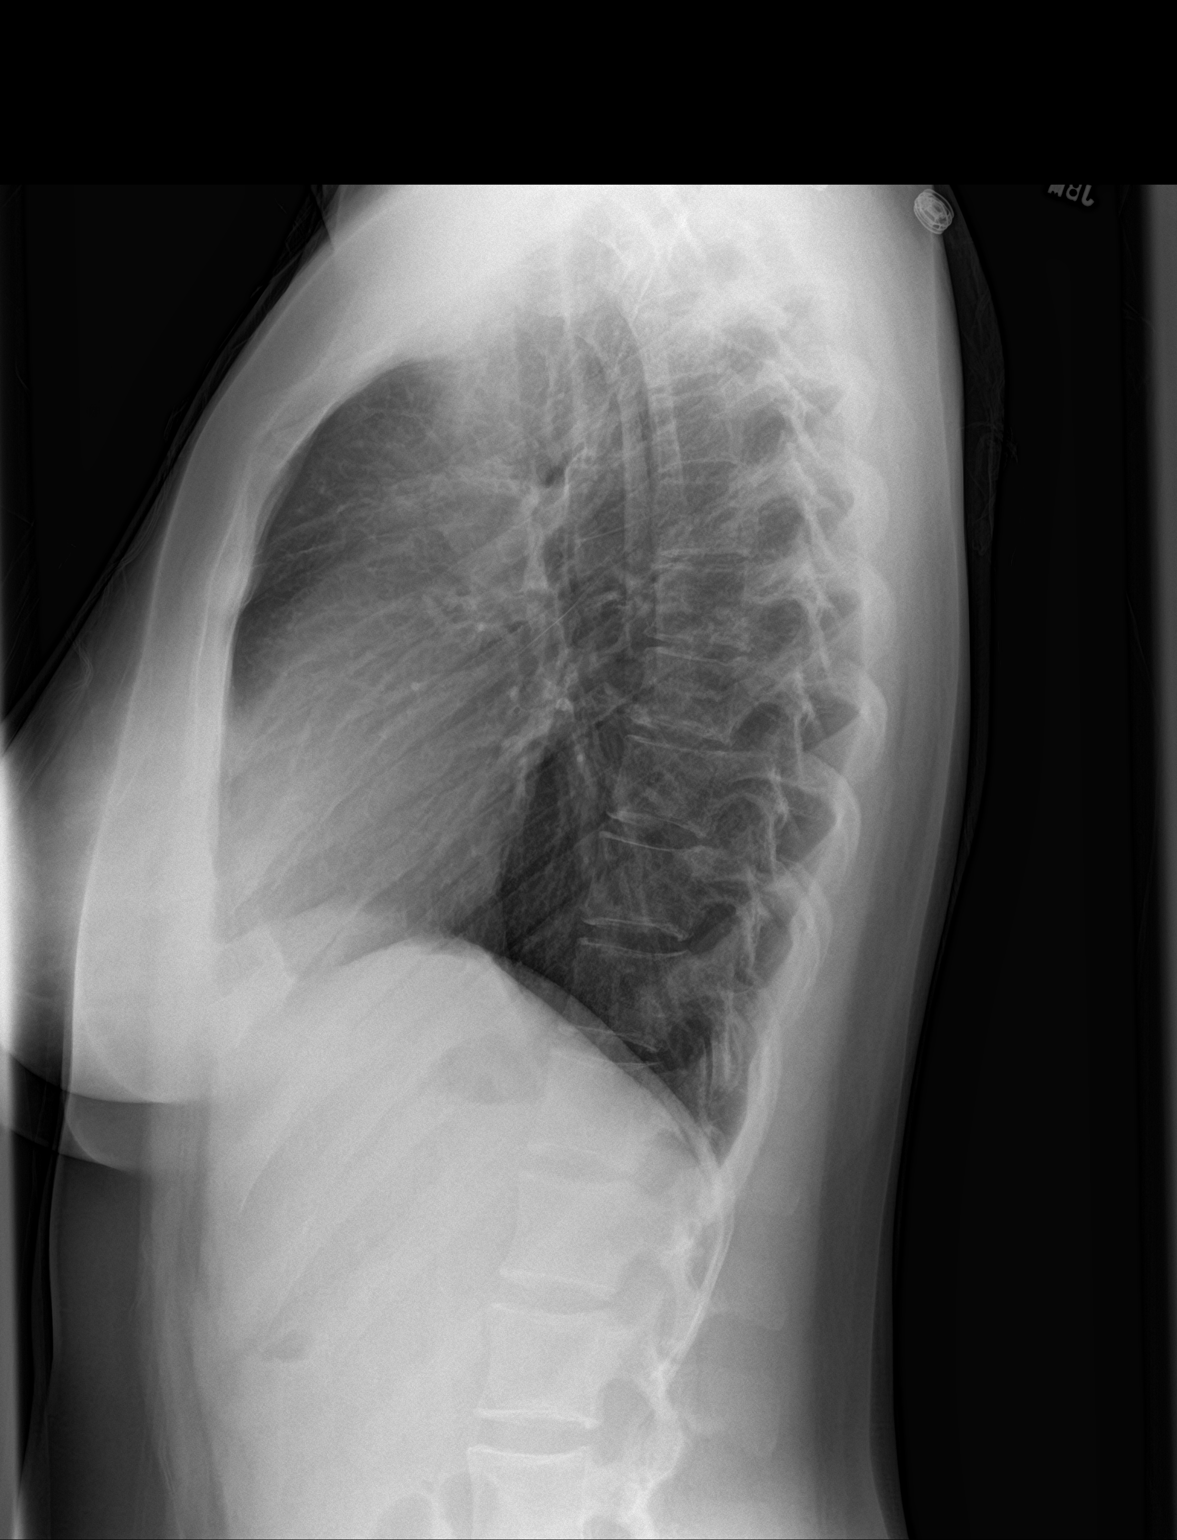

[2 of 2 positions shown; findings below may reference images not displayed]

FINDINGS: The heart size and mediastinal contours are within normal limits.
Both lungs are clear. No pneumothorax or pleural effusion is noted.
The visualized skeletal structures are unremarkable.
IMPRESSION: No active cardiopulmonary disease.

## 2017-01-04 IMAGING — CT CT ANGIO CHEST
2 of 6 series · 19 of 36 positions shown · IV contrast (OMNIPAQUE 350)
Comparison: None.

CLINICAL DATA: Right-sided chest pain and shortness of breath.

EXAM:
CT ANGIOGRAPHY CHEST WITH CONTRAST
TECHNIQUE: Multidetector CT imaging of the chest was performed using the
standard protocol during bolus administration of intravenous
contrast. Multiplanar CT image reconstructions and MIPs were
obtained to evaluate the vascular anatomy.
CONTRAST:  75mL OMNIPAQUE IOHEXOL 350 MG/ML SOLN

[Series 6: thins for pacs · axial · 0.59mm/px · z∈[-208,-3]mm · 18 of 229 slices shown]
[im 12/229  lung]
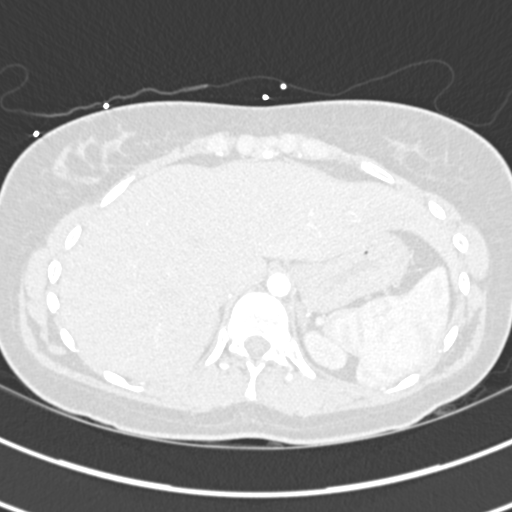
[im 23/229  mediastinal]
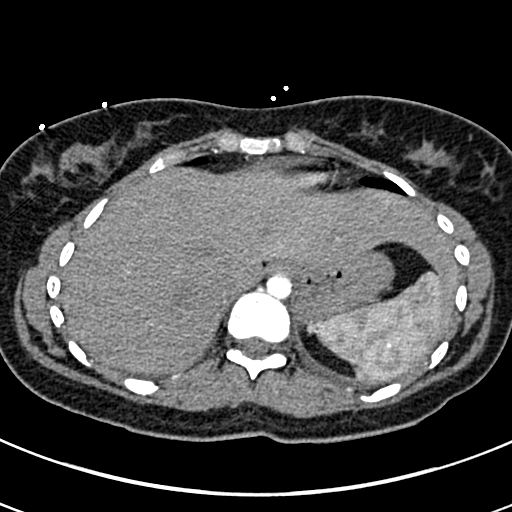
[im 35/229  lung]
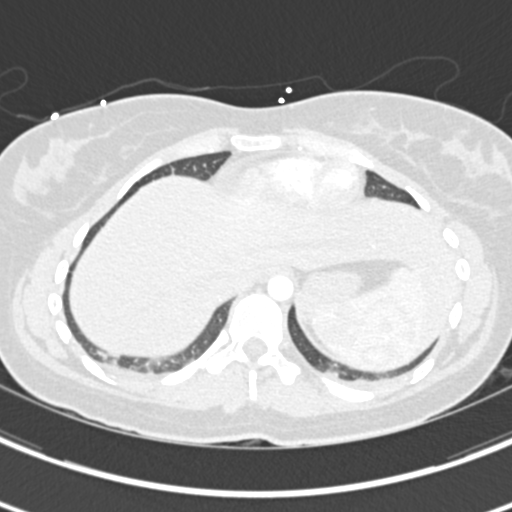
[im 46/229  mediastinal]
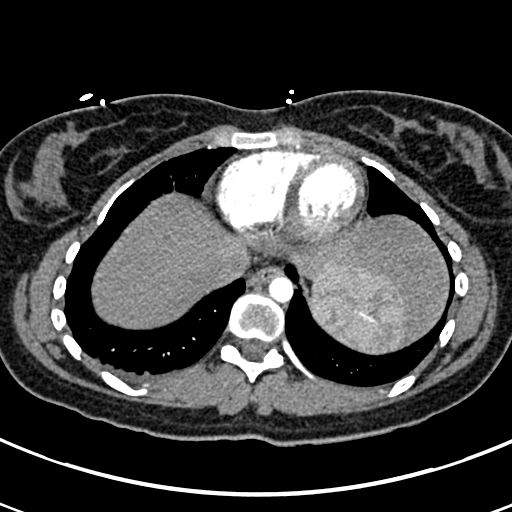
[im 58/229  lung]
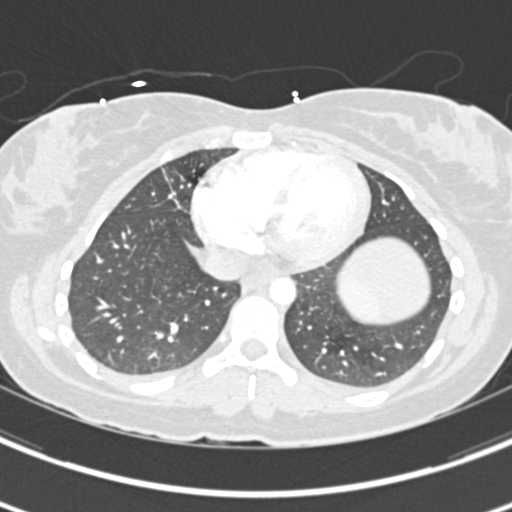
[im 69/229  mediastinal]
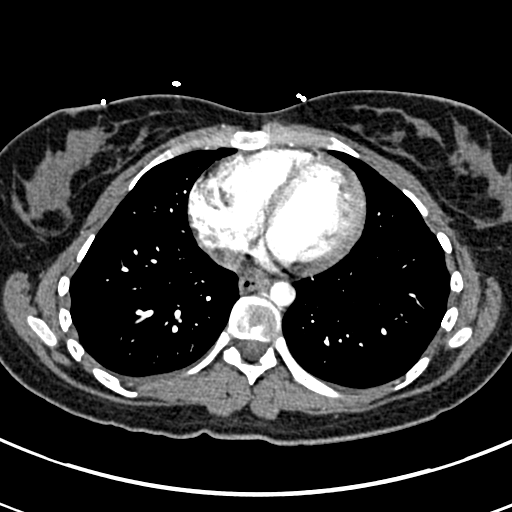
[im 80/229  lung]
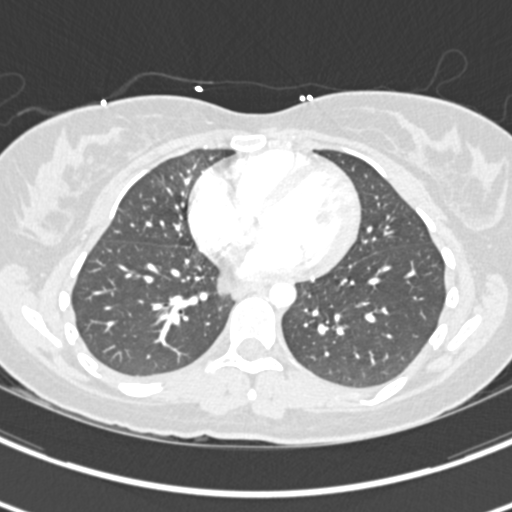
[im 92/229  mediastinal]
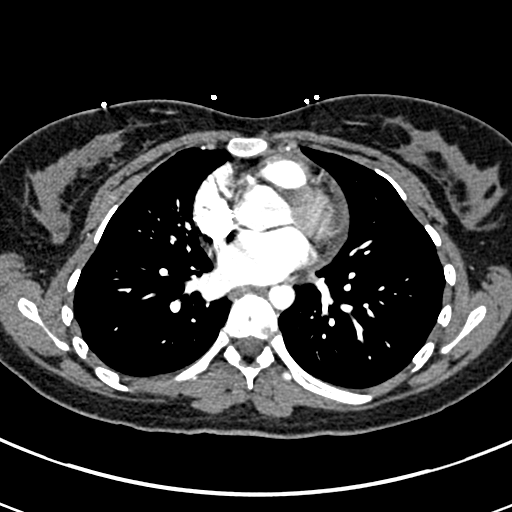
[im 103/229  lung]
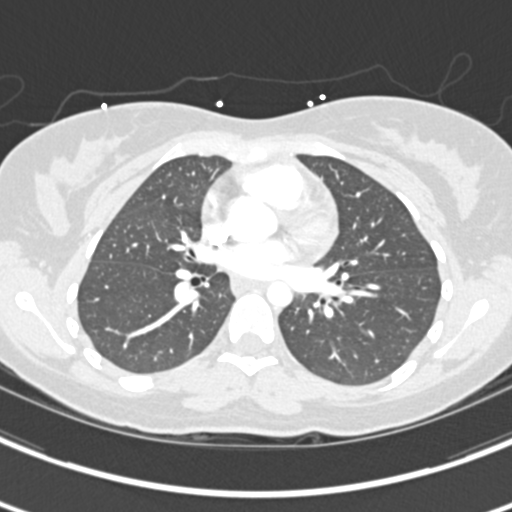
[im 126/229  mediastinal]
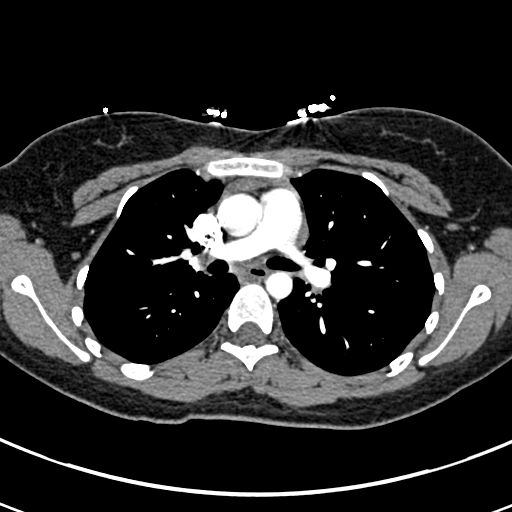
[im 137/229  lung]
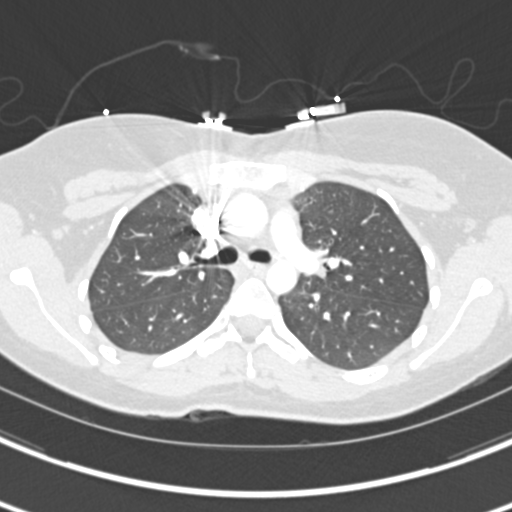
[im 149/229  mediastinal]
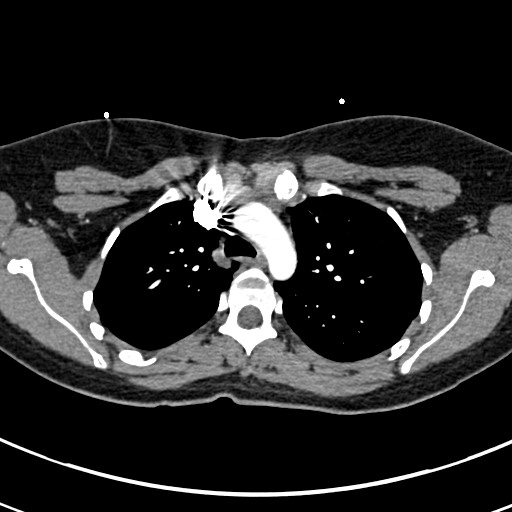
[im 160/229  lung]
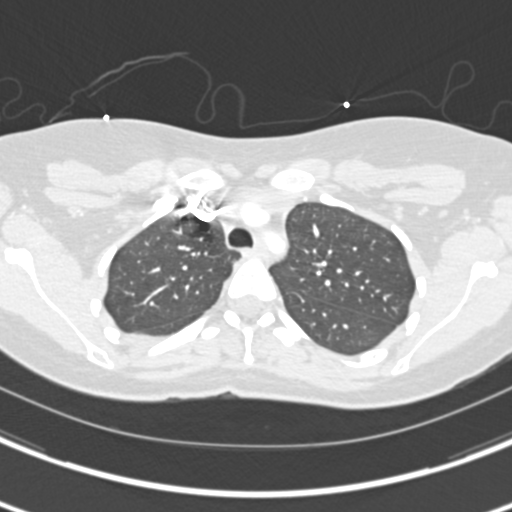
[im 172/229  mediastinal]
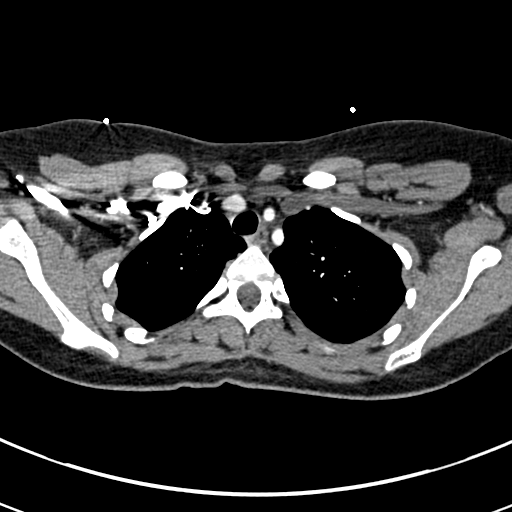
[im 183/229  lung]
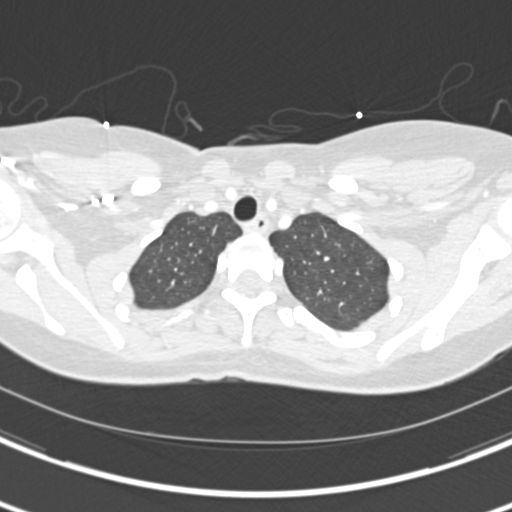
[im 194/229  mediastinal]
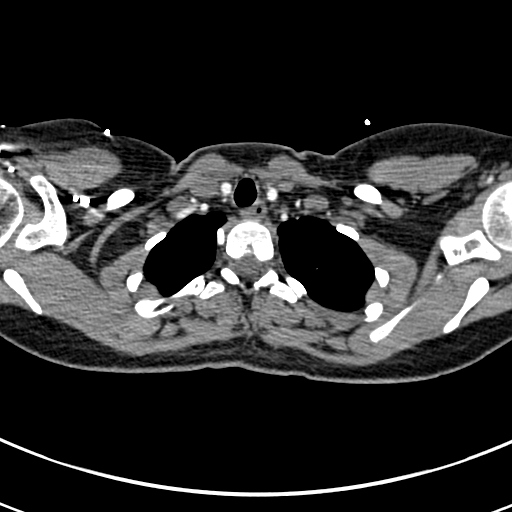
[im 206/229  lung]
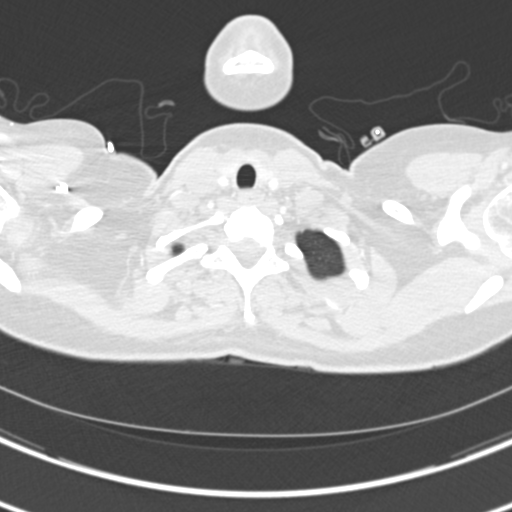
[im 217/229  mediastinal]
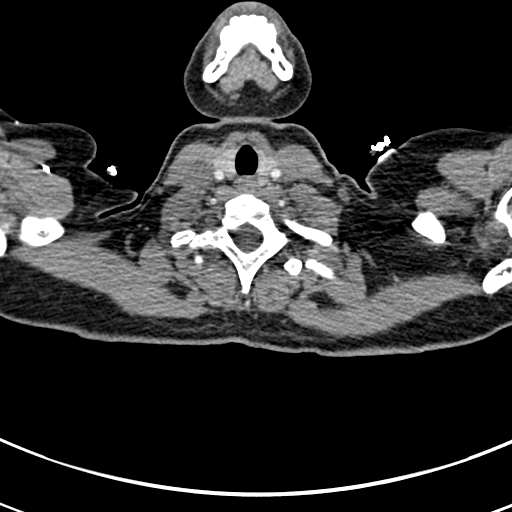

[Series 8: coronal mpr · coronal · 0.45mm/px · 1 of 100 slices shown]
[im 50/100  mediastinal]
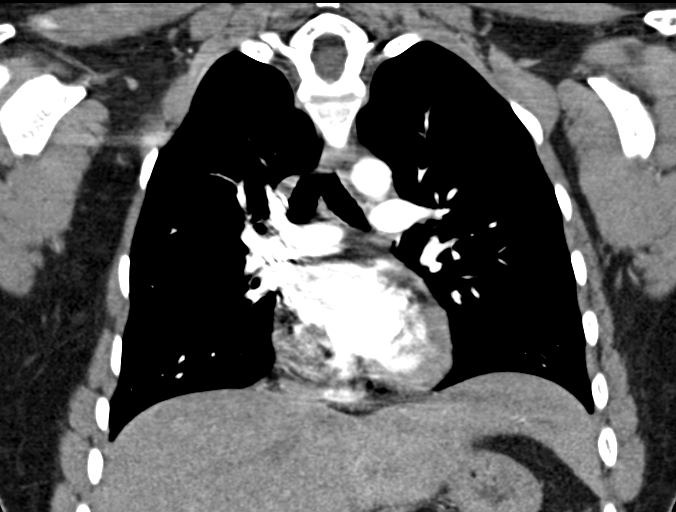

[19 of 36 positions shown; findings below may reference images not displayed]

FINDINGS: There is adequate opacification of the pulmonary arteries. There is
no pulmonary embolus. The main pulmonary artery, right main
pulmonary artery and left main pulmonary arteries are normal in
size. The heart size is normal. There is no pericardial effusion.
The thoracic aorta is normal in caliber. There is no thoracic aortic
dissection.

There is mild bibasilar atelectasis. The lungs are otherwise clear.
There is no focal consolidation, pleural effusion or pneumothorax.

There is no axillary, hilar, or mediastinal adenopathy.

There is no lytic or blastic osseous lesion.

The visualized portions of the upper abdomen are unremarkable.

Review of the MIP images confirms the above findings.
IMPRESSION: 1. No evidence of pulmonary embolus.
2. No acute cardiopulmonary disease. No findings to explain the
patient's right-sided chest pain.

## 2019-11-15 ENCOUNTER — Ambulatory Visit: Payer: BLUE CROSS/BLUE SHIELD | Attending: Internal Medicine

## 2019-11-15 DIAGNOSIS — Z23 Encounter for immunization: Secondary | ICD-10-CM

## 2019-11-15 NOTE — Progress Notes (Signed)
   Covid-19 Vaccination Clinic  Name:  Kelly Hill    MRN: [id number] DOB: 1995-09-03  11/15/2019  [name] was observed post Covid-19 immunization for 15 minutes without incident. She was provided with Vaccine Information Sheet and instruction to access the V-Safe system.   [name] was instructed to call 911 with any severe reactions post vaccine: Marland Kitchen Difficulty breathing  . Swelling of face and throat  . A fast heartbeat  . A bad rash all over body  . Dizziness and weakness   Immunizations Administered    Name Date Dose VIS Date Route   Pfizer COVID-19 Vaccine 11/15/2019  8:19 AM 0.3 mL 07/13/2019 Intramuscular   Manufacturer: [employer], [employer]   Lot: [id number]   NDC: [address]-5

## 2019-12-10 ENCOUNTER — Ambulatory Visit: Payer: BLUE CROSS/BLUE SHIELD | Attending: Internal Medicine

## 2019-12-10 DIAGNOSIS — Z23 Encounter for immunization: Secondary | ICD-10-CM

## 2019-12-10 NOTE — Progress Notes (Signed)
   Covid-19 Vaccination Clinic  Name:  Leinaala Catanese    MRN: [id number] DOB: [birth year]  12/10/2019  Ms. Tappen was observed post Covid-19 immunization for 15 minutes without incident. She was provided with Vaccine Information Sheet and instruction to access the V-Safe system.   Ms. Rosman was instructed to call 911 with any severe reactions post vaccine: Marland Kitchen Difficulty breathing  . Swelling of face and throat  . A fast heartbeat  . A bad rash all over body  . Dizziness and weakness   Immunizations Administered    Name Date Dose VIS Date Route   Pfizer COVID-19 Vaccine 12/10/2019  8:41 AM 0.3 mL 09/26/2018 Intramuscular   Manufacturer: [employer], [employer]   Lot: [id number]   NDC: [address]-3

## 2020-11-22 ENCOUNTER — Encounter (HOSPITAL_COMMUNITY): Payer: Self-pay

## 2020-11-22 ENCOUNTER — Other Ambulatory Visit: Payer: Self-pay

## 2020-11-22 ENCOUNTER — Ambulatory Visit (HOSPITAL_COMMUNITY)
Admission: EM | Admit: 2020-11-22 | Discharge: 2020-11-22 | Disposition: A | Discharge status: Home / Self Care | Payer: 59 | Attending: Family Medicine | Admitting: Family Medicine

## 2020-11-22 DIAGNOSIS — H6982 Other specified disorders of Eustachian tube, left ear: Secondary | ICD-10-CM | POA: Diagnosis not present

## 2020-11-22 MED ORDER — FLUTICASONE PROPIONATE 50 MCG/ACT NA SUSP: 1.0000 | Freq: Two times a day (BID) | NASAL | 2 refills | Status: DC

## 2020-11-22 MED ORDER — PREDNISONE 50 MG PO TABS: ORAL_TABLET | ORAL | 0 refills | Status: DC

## 2020-11-22 NOTE — ED Provider Notes (Signed)
[facility]    CSN: [id number] Arrival date & time: 11/22/20  1006      History   Chief Complaint Chief Complaint  Patient presents with  . Ear Fullness    HPI Kelly Hill is a 25 y.o. female.   Patient presenting today with 2-week history of left ear fullness, pressure, muffled hearing.  Denies fever, chills, congestion, sore throat, drainage from the ear, headache.  Trying over-the-counter eardrops for wax removal without any benefit.  Does have history of seasonal allergies but usually only affecting her in the wintertime.     History reviewed. No pertinent past medical history.  There are no problems to display for this patient.   History reviewed. No pertinent surgical history.  OB History   No obstetric history on file.      Home Medications    Prior to Admission medications   Medication Sig Start Date End Date Taking? Authorizing Provider  fluticasone (FLONASE) 50 MCG/ACT nasal spray Place 1 spray into both nostrils in the morning and at bedtime. 11/22/20  Yes Particia Nearing, PA-C  predniSONE (DELTASONE) 50 MG tablet Take one tab daily with breakfast for 3 days 11/22/20  Yes Particia Nearing, PA-C  cephALEXin (KEFLEX) 500 MG capsule Take 1 capsule (500 mg total) by mouth 4 (four) times daily. 06/02/15   Everlene Farrier, PA-C  cyclobenzaprine (FLEXERIL) 10 MG tablet Take 10 mg by mouth daily. 05/26/15   [provider]  naproxen (NAPROSYN) 250 MG tablet Take 1 tablet (250 mg total) by mouth 2 (two) times daily with a meal. 06/02/15   Everlene Farrier, PA-C  predniSONE (STERAPRED UNI-PAK 21 TAB) 10 MG (21) TBPK tablet Take 10-60 mg by mouth as directed. Take 6-5-4-3-2-1 tablets on consecutive days for 6 days. 05/26/15   [provider]    Family History Family History  Family history unknown: Yes    Social History Social History   Tobacco Use  . Smoking status: Never Smoker  Substance Use Topics  . Alcohol use:  No  . Drug use: No     Allergies   Patient has no known allergies.   Review of Systems Review of Systems Per HPI  Physical Exam Triage Vital Signs ED Triage Vitals  Enc Vitals Group     BP 11/22/20 1031 118/84     Pulse Rate 11/22/20 1031 86     Resp 11/22/20 1031 18     Temp 11/22/20 1031 98 F (36.7 C)     Temp src --      SpO2 11/22/20 1031 100 %     Weight --      Height --      Head Circumference --      Peak Flow --      Pain Score 11/22/20 1028 1     Pain Loc --      Pain Edu? --      Excl. in [place]? --    No data found.  Updated Vital Signs BP 118/84   Pulse 86   Temp 98 F (36.7 C)   Resp 18   LMP 11/03/2020   SpO2 100%   Visual Acuity Right Eye Distance:   Left Eye Distance:   Bilateral Distance:    Right Eye Near:   Left Eye Near:    Bilateral Near:     Physical Exam Vitals and nursing note reviewed.  Constitutional:      Appearance: Normal appearance. She is not ill-appearing.  HENT:     Head: Atraumatic.     Ears:     Comments: Mild bilateral middle ear effusions    Nose: Nose normal.     Mouth/Throat:     Mouth: Mucous membranes are moist.     Pharynx: Oropharynx is clear. No posterior oropharyngeal erythema.  Eyes:     Extraocular Movements: Extraocular movements intact.     Conjunctiva/sclera: Conjunctivae normal.  Cardiovascular:     Rate and Rhythm: Normal rate and regular rhythm.     Heart sounds: Normal heart sounds.  Pulmonary:     Effort: Pulmonary effort is normal.     Breath sounds: Normal breath sounds.  Abdominal:     General: Bowel sounds are normal. There is no distension.     Palpations: Abdomen is soft.     Tenderness: There is no abdominal tenderness. There is no guarding.  Musculoskeletal:        General: Normal range of motion.     Cervical back: Normal range of motion and neck supple.  Skin:    General: Skin is warm and dry.  Neurological:     Mental Status: She is alert and oriented to person, place,  and time.  Psychiatric:        Mood and Affect: Mood normal.        Thought Content: Thought content normal.        Judgment: Judgment normal.      UC Treatments / Results  Labs (all labs ordered are listed, but only abnormal results are displayed) Labs Reviewed - No data to display  EKG   Radiology No results found.  Procedures Procedures (including critical care time)  Medications Ordered in UC Medications - No data to display  Initial Impression / Assessment and Plan / UC Course  I have reviewed the triage vital signs and the nursing notes.  Pertinent labs & imaging results that were available during my care of the patient were reviewed by me and considered in my medical decision making (see chart for details).     Suspect eustachian tube dysfunction causing her ear pressure and fullness.  No cerumen present and left EAC, no evidence of ear infection.  We will treat with short prednisone burst, Flonase, Sudafed, over-the-counter pain relievers.  Return for acutely worsening symptoms.  Final Clinical Impressions(s) / UC Diagnoses   Final diagnoses:  Dysfunction of left eustachian tube     Discharge Instructions     Use the Flonase twice daily and can supplement with Sudafed as needed to help reduce the pressure in the ear.  Take the prednisone for 3 days with breakfast.    ED Prescriptions    Medication Sig Dispense Auth. Provider   predniSONE (DELTASONE) 50 MG tablet Take one tab daily with breakfast for 3 days 3 tablet Particia Nearing, PA-C   fluticasone [facility]) 50 MCG/ACT nasal spray Place 1 spray into both nostrils in the morning and at bedtime. 16 g Particia Nearing, [place]     PDMP not reviewed this encounter.   Particia Nearing, [place] 11/22/20 1126

## 2020-11-22 NOTE — ED Triage Notes (Signed)
Pt presents with c/o ear fullness, left is worse than right for past 2 weeks

## 2020-11-22 NOTE — Discharge Instructions (Signed)
Use the Flonase twice daily and can supplement with Sudafed as needed to help reduce the pressure in the ear.  Take the prednisone for 3 days with breakfast.

## 2021-03-06 ENCOUNTER — Ambulatory Visit: Payer: BLUE CROSS/BLUE SHIELD | Admitting: Internal Medicine

## 2021-05-22 ENCOUNTER — Encounter (HOSPITAL_COMMUNITY): Payer: Self-pay | Admitting: Emergency Medicine

## 2021-05-22 ENCOUNTER — Ambulatory Visit (HOSPITAL_COMMUNITY)
Admission: EM | Admit: 2021-05-22 | Discharge: 2021-05-22 | Disposition: A | Discharge status: Home / Self Care | Payer: 59 | Attending: Physician Assistant | Admitting: Physician Assistant

## 2021-05-22 ENCOUNTER — Other Ambulatory Visit: Payer: Self-pay

## 2021-05-22 DIAGNOSIS — R0981 Nasal congestion: Secondary | ICD-10-CM | POA: Diagnosis present

## 2021-05-22 DIAGNOSIS — R051 Acute cough: Secondary | ICD-10-CM

## 2021-05-22 DIAGNOSIS — Z20822 Contact with and (suspected) exposure to covid-19: Secondary | ICD-10-CM | POA: Diagnosis not present

## 2021-05-22 DIAGNOSIS — R5383 Other fatigue: Secondary | ICD-10-CM | POA: Diagnosis not present

## 2021-05-22 DIAGNOSIS — J069 Acute upper respiratory infection, unspecified: Secondary | ICD-10-CM | POA: Diagnosis not present

## 2021-05-22 DIAGNOSIS — R0789 Other chest pain: Secondary | ICD-10-CM | POA: Diagnosis not present

## 2021-05-22 DIAGNOSIS — J029 Acute pharyngitis, unspecified: Secondary | ICD-10-CM | POA: Diagnosis not present

## 2021-05-22 DIAGNOSIS — Z7952 Long term (current) use of systemic steroids: Secondary | ICD-10-CM | POA: Insufficient documentation

## 2021-05-22 LAB — POCT RAPID STREP A, ED / UC: Streptococcus, Group A Screen (Direct): NEGATIVE

## 2021-05-22 LAB — SARS CORONAVIRUS 2 (TAT 6-24 HRS): SARS Coronavirus 2: NEGATIVE

## 2021-05-22 MED ORDER — PREDNISONE 20 MG PO TABS: 40.0000 mg | ORAL_TABLET | Freq: Every day | ORAL | 0 refills | Status: AC

## 2021-05-22 NOTE — ED Provider Notes (Signed)
[facility]    CSN: [id number] Arrival date & time: 05/22/21  0846      History   Chief Complaint Chief Complaint  Patient presents with   Nasal Congestion   Sore Throat    HPI Kelly Hill is a 25 y.o. female.   Patient presents today with a 4 to 5-day history of URI symptoms.  Reports cough, nasal congestion, hoarseness, drainage, sinus pressure, fatigue, chest tightness, burning with coughing.  Denies any chest pain, shortness of breath, nausea, vomiting, headache, body aches.  She has tried over-the-counter DayQuil and NyQuil without improvement of symptoms.  Denies any known sick contacts.  She has had COVID-19 several years ago.  She is vaccinated against COVID-19 including booster.  Denies any history of asthma, allergies, smoking; does vape occasionally.   History reviewed. No pertinent past medical history.  There are no problems to display for this patient.   History reviewed. No pertinent surgical history.  OB History   No obstetric history on file.      Home Medications    Prior to Admission medications   Medication Sig Start Date End Date Taking? Authorizing Provider  predniSONE (DELTASONE) 20 MG tablet Take 2 tablets (40 mg total) by mouth daily for 5 days. 05/22/21 05/27/21 Yes Kazimir Hartnett, Noberto Retort, PA-C    Family History Family History  Family history unknown: Yes    Social History Social History   Tobacco Use   Smoking status: Never   Smokeless tobacco: Never  Substance Use Topics   Alcohol use: No   Drug use: No     Allergies   Patient has no known allergies.   Review of Systems Review of Systems  Constitutional:  Positive for activity change and fatigue. Negative for appetite change and fever.  HENT:  Positive for congestion, sinus pressure and sore throat. Negative for sneezing.   Respiratory:  Positive for cough and chest tightness. Negative for shortness of breath.   Cardiovascular:  Negative for chest pain.   Gastrointestinal:  Positive for diarrhea. Negative for abdominal pain, nausea and vomiting.  Musculoskeletal:  Negative for arthralgias and myalgias.  Neurological:  Negative for dizziness, light-headedness and headaches.    Physical Exam Triage Vital Signs ED Triage Vitals [05/22/21 0912]  Enc Vitals Group     BP 127/85     Pulse Rate 93     Resp 16     Temp 98.1 F (36.7 C)     Temp Source Oral     SpO2 98 %     Weight      Height      Head Circumference      Peak Flow      Pain Score 3     Pain Loc      Pain Edu?      Excl. in GC?    No data found.  Updated Vital Signs BP 127/85   Pulse 93   Temp 98.1 F (36.7 C) (Oral)   Resp 16   LMP 04/21/2021 (Approximate)   SpO2 98%   Visual Acuity Right Eye Distance:   Left Eye Distance:   Bilateral Distance:    Right Eye Near:   Left Eye Near:    Bilateral Near:     Physical Exam Vitals reviewed.  Constitutional:      General: She is awake. She is not in acute distress.    Appearance: Normal appearance. She is well-developed. She is not ill-appearing.     Comments: Very  pleasant female appears stated age in no acute distress sitting comfortably in exam room  HENT:     Head: Normocephalic and atraumatic.     Right Ear: Tympanic membrane, ear canal and external ear normal. Tympanic membrane is not erythematous or bulging.     Left Ear: Tympanic membrane, ear canal and external ear normal. Tympanic membrane is not erythematous or bulging.     Nose:     Right Sinus: Maxillary sinus tenderness present. No frontal sinus tenderness.     Left Sinus: Maxillary sinus tenderness present. No frontal sinus tenderness.     Mouth/Throat:     Pharynx: Uvula midline. Posterior oropharyngeal erythema present. No oropharyngeal exudate.  Cardiovascular:     Rate and Rhythm: Normal rate and regular rhythm.     Heart sounds: Normal heart sounds, S1 normal and S2 normal. No murmur heard. Pulmonary:     Effort: Pulmonary effort is  normal.     Breath sounds: Normal breath sounds. No wheezing, rhonchi or rales.     Comments: Clear to auscultation bilaterally Psychiatric:        Behavior: Behavior is cooperative.     UC Treatments / Results  Labs (all labs ordered are listed, but only abnormal results are displayed) Labs Reviewed  SARS CORONAVIRUS 2 (TAT 6-24 HRS)    EKG   Radiology No results found.  Procedures Procedures (including critical care time)  Medications Ordered in UC Medications - No data to display  Initial Impression / Assessment and Plan / UC Course  I have reviewed the triage vital signs and the nursing notes.  Pertinent labs & imaging results that were available during my care of the patient were reviewed by me and considered in my medical decision making (see chart for details).     Discussed likely viral etiology given short duration of symptoms.  No evidence of acute infection that warrant initiation of antibiotics today.  We will start patient on prednisone given chest tightness.  She was instructed to avoid NSAIDs with this medication due to risk of GI bleeding.  Recommended over-the-counter medications including Tylenol, Mucinex, Flonase for symptom relief.  We did not test her for flu since she has been symptomatic for more than 3 days but did test her for COVID and we will contact her if results are positive.  She was given work excuse note with current CDC return to work guidelines based on COVID test result.  Recommended she rest and drink plenty of fluid.  Discussed alarm symptoms that warrant emergent evaluation.  Strict return precautions given to which she expressed understanding.  Final Clinical Impressions(s) / UC Diagnoses   Final diagnoses:  Upper respiratory tract infection, unspecified type  Sore throat  Nasal congestion  Acute cough     Discharge Instructions      I believe that you have a virus.  Please use prednisone to help with your symptoms.  While  taking this medication do not take NSAIDs including aspirin, ibuprofen/Advil, naproxen/Aleve as it can cause stomach bleeding.  You can use Tylenol/acetaminophen.  I recommend using Tylenol/Mucinex/Flonase for symptom relief.  Make sure you rest and drink plenty of fluid.  We will contact you if your COVID test is positive.  If you have any worsening symptoms including high fever, nausea/vomiting interfering with oral intake, shortness of breath you need to be seen immediately for further evaluation as we discussed.     ED Prescriptions     Medication Sig Dispense Auth. Provider  predniSONE (DELTASONE) 20 MG tablet Take 2 tablets (40 mg total) by mouth daily for 5 days. 10 tablet Jackalynn Art, Noberto Retort, PA-C      PDMP not reviewed this encounter.   Jeani Hawking, PA-C 05/22/21 8250

## 2021-05-22 NOTE — Discharge Instructions (Signed)
I believe that you have a virus.  Please use prednisone to help with your symptoms.  While taking this medication do not take NSAIDs including aspirin, ibuprofen/Advil, naproxen/Aleve as it can cause stomach bleeding.  You can use Tylenol/acetaminophen.  I recommend using Tylenol/Mucinex/Flonase for symptom relief.  Make sure you rest and drink plenty of fluid.  We will contact you if your COVID test is positive.  If you have any worsening symptoms including high fever, nausea/vomiting interfering with oral intake, shortness of breath you need to be seen immediately for further evaluation as we discussed.

## 2021-05-22 NOTE — ED Triage Notes (Signed)
Pt c/o nasal and chest congestion since Sunday. Reports OTC medication with no relief.

## 2021-09-11 ENCOUNTER — Other Ambulatory Visit (HOSPITAL_COMMUNITY): Payer: Self-pay

## 2021-09-11 ENCOUNTER — Ambulatory Visit (HOSPITAL_COMMUNITY)
Admission: EM | Admit: 2021-09-11 | Discharge: 2021-09-11 | Disposition: A | Discharge status: Home / Self Care | Payer: Managed Care, Other (non HMO) | Attending: Family Medicine | Admitting: Family Medicine

## 2021-09-11 ENCOUNTER — Encounter (HOSPITAL_COMMUNITY): Payer: Self-pay

## 2021-09-11 DIAGNOSIS — H65192 Other acute nonsuppurative otitis media, left ear: Secondary | ICD-10-CM | POA: Insufficient documentation

## 2021-09-11 DIAGNOSIS — R112 Nausea with vomiting, unspecified: Secondary | ICD-10-CM | POA: Insufficient documentation

## 2021-09-11 DIAGNOSIS — J029 Acute pharyngitis, unspecified: Secondary | ICD-10-CM | POA: Diagnosis not present

## 2021-09-11 DIAGNOSIS — U071 COVID-19: Secondary | ICD-10-CM | POA: Diagnosis not present

## 2021-09-11 LAB — POC INFLUENZA A AND B ANTIGEN (URGENT CARE ONLY)
INFLUENZA A ANTIGEN, POC: NEGATIVE
INFLUENZA B ANTIGEN, POC: NEGATIVE

## 2021-09-11 LAB — SARS CORONAVIRUS 2 (TAT 6-24 HRS): SARS Coronavirus 2: POSITIVE — AB

## 2021-09-11 LAB — POCT RAPID STREP A, ED / UC: Streptococcus, Group A Screen (Direct): NEGATIVE

## 2021-09-11 MED ORDER — AMOXICILLIN 500 MG PO CAPS
500.0000 mg | ORAL_CAPSULE | Freq: Three times a day (TID) | ORAL | 0 refills | Status: AC
  Filled 2021-09-11: qty 21, 7d supply, fill #0

## 2021-09-11 NOTE — ED Provider Notes (Signed)
[facility]    CSN: [id number] Arrival date & time: 09/11/21  [id number]      History   Chief Complaint Chief Complaint  Patient presents with   Nausea   Chills   Tinnitus    HPI Kelly Hill is a 26 y.o. female.   Patient is here for URI symptoms.  Woke up with sore throat, then nasuea.  Last night she felt worse with chills, left ear pain.  Felt dizzy when she would get up. Some diarrhea this am, vomiting this morning.  She feels feverish, but no known fevers.  Runny nose, but that is chronic for her.  Mucous in her throat.  Mild cough.  No known sick contact.   History reviewed. No pertinent past medical history.  There are no problems to display for this patient.   History reviewed. No pertinent surgical history.  OB History   No obstetric history on file.      Home Medications    Prior to Admission medications   Not on File    Family History Family History  Family history unknown: Yes    Social History Social History   Tobacco Use   Smoking status: Never   Smokeless tobacco: Never  Substance Use Topics   Alcohol use: No   Drug use: No     Allergies   Patient has no known allergies.   Review of Systems Review of Systems  Constitutional:  Positive for chills and fatigue. Negative for fever.  HENT:  Positive for congestion, ear pain, rhinorrhea, sore throat and tinnitus. Negative for sinus pressure.   Eyes: Negative.   Respiratory:  Positive for cough. Negative for wheezing.   Cardiovascular: Negative.   Gastrointestinal:  Positive for nausea and vomiting.    Physical Exam Triage Vital Signs ED Triage Vitals  Enc Vitals Group     BP 09/11/21 0817 130/80     Pulse Rate 09/11/21 0817 (!) 107     Resp 09/11/21 0817 16     Temp 09/11/21 0817 98.2 F (36.8 C)     Temp Source 09/11/21 0817 Oral     SpO2 09/11/21 0817 98 %     Weight 09/11/21 0819 158 lb (71.7 kg)     Height --      Head Circumference --      Peak Flow --       Pain Score 09/11/21 0818 4     Pain Loc --      Pain Edu? --      Excl. in [place]? --    No data found.  Updated Vital Signs BP 130/80 (BP Location: Left Arm)    Pulse (!) 107    Temp 98.2 F (36.8 C) (Oral)    Resp 16    Wt 71.7 kg    SpO2 98%    BMI 28.90 kg/m   Visual Acuity Right Eye Distance:   Left Eye Distance:   Bilateral Distance:    Right Eye Near:   Left Eye Near:    Bilateral Near:     Physical Exam Constitutional:      Appearance: Normal appearance.  HENT:     Head: Normocephalic and atraumatic.     Left Ear: Tympanic membrane is erythematous and bulging.     Nose: Congestion present.     Mouth/Throat:     Mouth: Mucous membranes are moist.     Pharynx: Posterior oropharyngeal erythema present.  Cardiovascular:     Rate and Rhythm:  Normal rate and regular rhythm.  Pulmonary:     Effort: Pulmonary effort is normal.     Breath sounds: Normal breath sounds.  Musculoskeletal:     Cervical back: Normal range of motion and neck supple. No tenderness.  Lymphadenopathy:     Cervical: Cervical adenopathy present.  Neurological:     Mental Status: She is alert.     UC Treatments / Results  Labs (all labs ordered are listed, but only abnormal results are displayed) Labs Reviewed  SARS CORONAVIRUS 2 (TAT 6-24 HRS)  CULTURE, GROUP A STREP [facility])  POCT RAPID STREP A, ED / UC  POC INFLUENZA A AND B ANTIGEN (URGENT CARE ONLY)    EKG   Radiology No results found.  Procedures Procedures (including critical care time)  Medications Ordered in UC Medications - No data to display  Initial Impression / Assessment and Plan / UC Course  I have reviewed the triage vital signs and the nursing notes.  Pertinent labs & imaging results that were available during my care of the patient were reviewed by me and considered in my medical decision making (see chart for details).     Final Clinical Impressions(s) / UC Diagnoses   Final diagnoses:  Other non-recurrent  acute nonsuppurative otitis media of left ear  Sore throat  Nausea and vomiting, unspecified vomiting type     Discharge Instructions      You were seen today for upper respiratory symptoms.  Your rapid strep was negative today.  This will be sent for culture.  Your flu swab was negative as well.  Your covid swab is pending and will be resulted tomorrow.  This is likely a viral illness at this time.  You should take over the counter zyrtec or claritin, sudafed, mucinex for your symptoms.  I have treated you with amoxicillin for a left ear infection.  You should also use over the counter zyrtec and/or sudafed to help with sinus congestion.      ED Prescriptions     Medication Sig Dispense Auth. Provider   amoxicillin (AMOXIL) 500 MG capsule Take 1 capsule (500 mg total) by mouth 3 (three) times daily. 21 capsule Rondel Oh, MD      PDMP not reviewed this encounter.   Rondel Oh, MD 09/11/21 0930

## 2021-09-11 NOTE — Discharge Instructions (Addendum)
You were seen today for upper respiratory symptoms.  Your rapid strep was negative today.  This will be sent for culture.  Your flu swab was negative as well.  Your covid swab is pending and will be resulted tomorrow.  This is likely a viral illness at this time.  You should take over the counter zyrtec or claritin, sudafed, mucinex for your symptoms.  I have treated you with amoxicillin for a left ear infection.  You should also use over the counter zyrtec and/or sudafed to help with sinus congestion.

## 2021-09-11 NOTE — ED Triage Notes (Signed)
Pt presents with nausea, cough, congestion, body aches, diarrhea, chills, and ear ringing that began yesterday.

## 2021-09-13 LAB — CULTURE, GROUP A STREP (THRC)

## 2022-06-22 ENCOUNTER — Encounter (HOSPITAL_COMMUNITY): Payer: Self-pay

## 2022-06-22 ENCOUNTER — Other Ambulatory Visit: Payer: Self-pay

## 2022-06-22 ENCOUNTER — Emergency Department (HOSPITAL_COMMUNITY)
Admission: EM | Admit: 2022-06-22 | Discharge: 2022-06-23 | Disposition: A | Discharge status: Home / Self Care | Payer: Commercial Managed Care - HMO | Attending: Emergency Medicine | Admitting: Emergency Medicine

## 2022-06-22 DIAGNOSIS — R1013 Epigastric pain: Secondary | ICD-10-CM | POA: Insufficient documentation

## 2022-06-22 DIAGNOSIS — R1011 Right upper quadrant pain: Secondary | ICD-10-CM | POA: Insufficient documentation

## 2022-06-22 DIAGNOSIS — D72829 Elevated white blood cell count, unspecified: Secondary | ICD-10-CM | POA: Diagnosis not present

## 2022-06-22 NOTE — ED Triage Notes (Signed)
Epigastric pain that usually begins at night but got worse today.

## 2022-06-23 LAB — CBC WITH DIFFERENTIAL/PLATELET
Abs Immature Granulocytes: 0.05 10*3/uL (ref 0.00–0.07)
Basophils Absolute: 0.1 10*3/uL (ref 0.0–0.1)
Basophils Relative: 1 %
Eosinophils Absolute: 0.4 10*3/uL (ref 0.0–0.5)
Eosinophils Relative: 3 %
HCT: 41.5 % (ref 36.0–46.0)
Hemoglobin: 14.1 g/dL (ref 12.0–15.0)
Immature Granulocytes: 0 %
Lymphocytes Relative: 30 %
Lymphs Abs: 3.8 10*3/uL (ref 0.7–4.0)
MCH: 28.8 pg (ref 26.0–34.0)
MCHC: 34 g/dL (ref 30.0–36.0)
MCV: 84.7 fL (ref 80.0–100.0)
Monocytes Absolute: 0.8 10*3/uL (ref 0.1–1.0)
Monocytes Relative: 6 %
Neutro Abs: 7.5 10*3/uL (ref 1.7–7.7)
Neutrophils Relative %: 60 %
Platelets: 384 10*3/uL (ref 150–400)
RBC: 4.9 MIL/uL (ref 3.87–5.11)
RDW: 13.3 % (ref 11.5–15.5)
WBC: 12.6 10*3/uL — ABNORMAL HIGH (ref 4.0–10.5)
nRBC: 0 % (ref 0.0–0.2)

## 2022-06-23 LAB — COMPREHENSIVE METABOLIC PANEL
ALT: 26 U/L (ref 0–44)
AST: 22 U/L (ref 15–41)
Albumin: 4.4 g/dL (ref 3.5–5.0)
Alkaline Phosphatase: 74 U/L (ref 38–126)
Anion gap: 8 (ref 5–15)
BUN: 13 mg/dL (ref 6–20)
CO2: 21 mmol/L — ABNORMAL LOW (ref 22–32)
Calcium: 9.7 mg/dL (ref 8.9–10.3)
Chloride: 109 mmol/L (ref 98–111)
Creatinine, Ser: 0.67 mg/dL (ref 0.44–1.00)
GFR, Estimated: >60 mL/min (ref >60)
Glucose, Bld: 101 mg/dL — ABNORMAL HIGH (ref 70–99)
Potassium: 4.1 mmol/L (ref 3.5–5.1)
Sodium: 138 mmol/L (ref 135–145)
Total Bilirubin: 0.5 mg/dL (ref 0.3–1.2)
Total Protein: 8 g/dL (ref 6.5–8.1)

## 2022-06-23 LAB — URINALYSIS, ROUTINE W REFLEX MICROSCOPIC
Bilirubin Urine: NEGATIVE
Glucose, UA: NEGATIVE mg/dL
Hgb urine dipstick: NEGATIVE
Ketones, ur: NEGATIVE mg/dL
Leukocytes,Ua: NEGATIVE
Nitrite: NEGATIVE
Protein, ur: NEGATIVE mg/dL
Specific Gravity, Urine: 1.021 (ref 1.005–1.030)
pH: 7 (ref 5.0–8.0)

## 2022-06-23 LAB — I-STAT BETA HCG BLOOD, ED (MC, WL, AP ONLY): I-stat hCG, quantitative: <5 m[IU]/mL (ref <5)

## 2022-06-23 LAB — LIPASE, BLOOD: Lipase: 35 U/L (ref 11–51)

## 2022-06-23 MED ORDER — OMEPRAZOLE 20 MG PO CPDR: 20.0000 mg | DELAYED_RELEASE_CAPSULE | Freq: Every day | ORAL | 0 refills | Status: AC

## 2022-06-23 MED ORDER — SUCRALFATE 1 G PO TABS: 1.0000 g | ORAL_TABLET | Freq: Three times a day (TID) | ORAL | 0 refills | Status: AC | PRN

## 2022-06-23 MED ORDER — OMEPRAZOLE 20 MG PO CPDR: 20.0000 mg | DELAYED_RELEASE_CAPSULE | Freq: Every day | ORAL | 0 refills | Status: DC

## 2022-06-23 MED ORDER — SUCRALFATE 1 G PO TABS: 1.0000 g | ORAL_TABLET | Freq: Three times a day (TID) | ORAL | 0 refills | Status: DC | PRN

## 2022-06-23 NOTE — ED Provider Notes (Signed)
[facility]-EMERGENCY DEPT Provider Note   CSN: [id number] Arrival date & time: 06/22/22  2203     History  Chief Complaint  Patient presents with   Abdominal Pain    Kelly Hill is a 26 y.o. female with noncontributory past medical history who presents with concern for epigastric abdominal pain intermittently for the last month, which only occurs at night, is very severe, not associate with nausea, vomiting, diarrhea, constipation.  Patient reports that the pain will last for several hours before resolving, she does report a history of some acid reflux, but never had such severe symptoms.  Patient reports is not significantly associated with her diet.  She denies fever, chills, dysuria, hematuria, stool changes.  Patient reports she has not seen PCP or GI doctor regarding these changes.  Patient reports that her pain tonight started around 7 PM which was significantly earlier than her usual, and at time of my evaluation several hours later she reports the pain is significantly subsided.   Abdominal Pain      Home Medications Prior to Admission medications   Medication Sig Start Date End Date Taking? Authorizing Provider  amoxicillin (AMOXIL) 500 MG capsule Take 1 capsule (500 mg total) by mouth 3 (three) times daily. 09/11/21   Piontek, Denny Peon, MD  omeprazole (PRILOSEC) 20 MG capsule Take 1 capsule (20 mg total) by mouth daily. 06/23/22   Fritzie Prioleau H, PA-C  sucralfate (CARAFATE) 1 g tablet Take 1 tablet (1 g total) by mouth 3 (three) times daily as needed. 06/23/22   Haddy Mullinax H, PA-C      Allergies    Patient has no known allergies.    Review of Systems   Review of Systems  Gastrointestinal:  Positive for abdominal pain.  All other systems reviewed and are negative.   Physical Exam Updated Vital Signs BP 133/74   Pulse 89   Temp 98 F (36.7 C)   Resp 18   Ht 5\' 2"  (1.575 m)   Wt 72.6 kg   SpO2 98%   BMI 29.26 kg/m  Physical  Exam Vitals and nursing note reviewed.  Constitutional:      General: She is not in acute distress.    Appearance: Normal appearance.  HENT:     Head: Normocephalic and atraumatic.  Eyes:     General:        Right eye: No discharge.        Left eye: No discharge.  Cardiovascular:     Rate and Rhythm: Normal rate and regular rhythm.     Heart sounds: No murmur heard.    No friction rub. No gallop.  Pulmonary:     Effort: Pulmonary effort is normal.     Breath sounds: Normal breath sounds.  Abdominal:     General: Bowel sounds are normal.     Palpations: Abdomen is soft.     Comments: Patient with some tenderness in the epigastric region, more focally in the right upper quadrant but without rebound, rigidity, guarding.  Skin:    General: Skin is warm and dry.     Capillary Refill: Capillary refill takes less than 2 seconds.  Neurological:     Mental Status: She is alert and oriented to person, place, and time.  Psychiatric:        Mood and Affect: Mood normal.        Behavior: Behavior normal.     ED Results / Procedures / Treatments   Labs (all labs  ordered are listed, but only abnormal results are displayed) Labs Reviewed  COMPREHENSIVE METABOLIC PANEL - Abnormal; Notable for the following components:      Result Value   CO2 21 (*)    Glucose, Bld 101 (*)    All other components within normal limits  CBC WITH DIFFERENTIAL/PLATELET - Abnormal; Notable for the following components:   WBC 12.6 (*)    All other components within normal limits  LIPASE, BLOOD  URINALYSIS, ROUTINE W REFLEX MICROSCOPIC  I-STAT BETA HCG BLOOD, ED ([facility], [facility], [facility] ONLY)    EKG None  Radiology No results found.  Procedures Procedures    Medications Ordered in ED Medications - No data to display  ED Course/ Medical Decision Making/ A&P                           Medical Decision Making Amount and/or Complexity of Data Reviewed Labs: ordered. Radiology: ordered.  Risk Prescription  drug management.   This patient is a 26 y.o. female who presents to the ED for concern of epigastric abdominal pain, this involves an extensive number of treatment options, and is a complaint that carries with it a high risk of complications and morbidity. The emergent differential diagnosis prior to evaluation includes, but is not limited to,  esophagitis, gastritis, peptic ulcer disease, esophageal rupture, gastric rupture, Boerhaave's, Mallory-Weiss, pancreatitis, cholecystitis, cholangitis, acute mesenteric ischemia, atypical chest pain or ACS, lower lobar pneumonia versus other .   This is not an exhaustive differential.   Past Medical History / Co-morbidities / Social History: Previous hx GERD  Physical Exam: Physical exam performed. The pertinent findings include: Patient with focal tenderness in the epigastric region, right upper quadrant without rebound, rigidity, guarding, no distention, no jaundice noted  Lab Tests: I ordered, and personally interpreted labs.  The pertinent results include: Patient with mild leukocytosis, white blood cells 12.6, otherwise unremarkable lab work including normal lipase, normal hepatic function tests   Imaging Studies: Consider right upper quadrant ultrasound in this patient, and do think that this would be a reasonable next step to rule out gallbladder pathology, however patient declines in the emergency department due to her protracted wait, and ultrasound not available for around 30 minutes to an hour from time of my evaluation, I think it be reasonable to have her follow-up for an outpatient ultrasound, and to return to the emergency department if abnormal results versus follow-up with PCP, GI if normal results   Disposition: After consideration of the diagnostic results and the patients response to treatment, I feel that discussed with patient that it is difficult to say without any additional imaging, however my suspicion is either esophagitis, acid  reflux, versus gallbladder pathology, although based on her clinical presentation today I do not think that she is having acute cholecystitis, and thus think it would be reasonable to follow-up outpatient for either subacute gallbladder pathology, or epigastric symptoms.   emergency department workup does not suggest an emergent condition requiring admission or immediate intervention beyond what has been performed at this time. The plan is: as above. The patient is safe for discharge and has been instructed to return immediately for worsening symptoms, change in symptoms or any other concerns.  I discussed this case with my attending physician Dr. Eudelia Bunch who cosigned this note including patient's presenting symptoms, physical exam, and planned diagnostics and interventions. Attending physician stated agreement with plan or made changes to plan which were implemented.  Final Clinical Impression(s) / ED Diagnoses Final diagnoses:  Epigastric abdominal pain    Rx / DC Orders ED Discharge Orders          Ordered    US Abdomen Limited RUQ (LIVER/GB)        06/23/22 0547    omeprazole (PRILOSEC) 20 MG capsule  Daily,   Status:  Discontinued        06/23/22 0547    sucralfate (CARAFATE) 1 g tablet  3 times daily PRN,   Status:  Discontinued        06/23/22 0547    omeprazole (PRILOSEC) 20 MG capsule  Daily        06/23/22 0615    sucralfate (CARAFATE) 1 g tablet  3 times daily PRN        06/23/22 0615              Olene Floss, PA-C 06/23/22 0370    Nira Conn, MD 06/23/22 [phone]

## 2022-06-23 NOTE — Discharge Instructions (Addendum)
As we discussed there were no specific changes in your lab work to suggest the diagnosis, the next testing that I would perform would be an ultrasound which I have ordered outpatient for you, you can return to [facility] during business hours and have this performed, if the results are abnormal or require any surgical intervention, you will need to be seen again in the emergency department, however if the results are nonspecific I still recommend that you follow-up with GI.  I have attached the eating plan, and some information regarding what we discussed above, as well as send those prescriptions.

## 2022-06-23 NOTE — ED Notes (Signed)
Pharmacy changed per pt request. All discharge instructions reviewed. Scheduling number provided for outpatient [place]. Denies further questions. Alert, oriented and ambulatory on departure.
